# Patient Record
Sex: Female | Born: 1975 | Race: Black or African American | Hispanic: No | Marital: Single | Smoking: Never smoker
Health system: Southern US, Community
[De-identification: ages and names within clinical notes are randomized; demographics above are authoritative.]

## PROBLEM LIST (undated history)

## (undated) DIAGNOSIS — F32A Depression, unspecified: Secondary | ICD-10-CM

## (undated) DIAGNOSIS — F329 Major depressive disorder, single episode, unspecified: Secondary | ICD-10-CM

## (undated) DIAGNOSIS — F419 Anxiety disorder, unspecified: Secondary | ICD-10-CM

## (undated) DIAGNOSIS — G8929 Other chronic pain: Secondary | ICD-10-CM

## (undated) HISTORY — DX: Anxiety disorder, unspecified: F41.9

## (undated) HISTORY — DX: Depression, unspecified: F32.A

## (undated) HISTORY — DX: Other chronic pain: G89.29

---
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
## 1999-11-26 ENCOUNTER — Encounter: Payer: Self-pay | Admitting: *Deleted

## 1999-11-26 ENCOUNTER — Inpatient Hospital Stay (HOSPITAL_COMMUNITY): Admission: AD | Admit: 1999-11-26 | Discharge: 1999-11-26 | Payer: Self-pay | Admitting: *Deleted

## 2000-01-07 ENCOUNTER — Other Ambulatory Visit: Admission: RE | Admit: 2000-01-07 | Discharge: 2000-01-07 | Payer: Self-pay | Admitting: *Deleted

## 2000-07-21 ENCOUNTER — Inpatient Hospital Stay (HOSPITAL_COMMUNITY): Admission: AD | Admit: 2000-07-21 | Discharge: 2000-07-21 | Payer: Self-pay | Admitting: Obstetrics

## 2000-07-23 ENCOUNTER — Inpatient Hospital Stay (HOSPITAL_COMMUNITY): Admission: AD | Admit: 2000-07-23 | Discharge: 2000-07-23 | Payer: Self-pay | Admitting: Obstetrics

## 2000-07-31 ENCOUNTER — Inpatient Hospital Stay (HOSPITAL_COMMUNITY): Admission: AD | Admit: 2000-07-31 | Discharge: 2000-08-04 | Payer: Self-pay | Admitting: *Deleted

## 2000-08-05 ENCOUNTER — Encounter: Admission: RE | Admit: 2000-08-05 | Discharge: 2000-09-04 | Payer: Self-pay | Admitting: *Deleted

## 2000-08-14 ENCOUNTER — Inpatient Hospital Stay (HOSPITAL_COMMUNITY): Admission: AD | Admit: 2000-08-14 | Discharge: 2000-08-14 | Payer: Self-pay | Admitting: Obstetrics & Gynecology

## 2000-08-18 ENCOUNTER — Encounter: Admission: RE | Admit: 2000-08-18 | Discharge: 2000-08-18 | Payer: Self-pay | Admitting: Obstetrics & Gynecology

## 2012-11-13 ENCOUNTER — Emergency Department (INDEPENDENT_AMBULATORY_CARE_PROVIDER_SITE_OTHER)
Admission: EM | Admit: 2012-11-13 | Discharge: 2012-11-13 | Disposition: A | Discharge status: Home / Self Care | Payer: Medicaid Other | Source: Home / Self Care | Attending: Family Medicine | Admitting: Family Medicine

## 2012-11-13 ENCOUNTER — Encounter (HOSPITAL_COMMUNITY): Payer: Self-pay | Admitting: Emergency Medicine

## 2012-11-13 DIAGNOSIS — L02219 Cutaneous abscess of trunk, unspecified: Secondary | ICD-10-CM

## 2012-11-13 DIAGNOSIS — L02216 Cutaneous abscess of umbilicus: Secondary | ICD-10-CM

## 2012-11-13 MED ORDER — SULFAMETHOXAZOLE-TRIMETHOPRIM 800-160 MG PO TABS: 1.0000 | ORAL_TABLET | Freq: Two times a day (BID) | ORAL | Status: DC

## 2012-11-13 NOTE — ED Provider Notes (Signed)
Sheila Weber is a 37 y.o. female who presents to Urgent Care today for small umbilical abscess starting Monday. It is somewhat itchy and not particularly painful. No fevers or chills nausea vomiting or diarrhea. She feels well otherwise.   History reviewed. No pertinent past medical history. History  Substance Use Topics  . Smoking status: Never Smoker   . Smokeless tobacco: Not on file  . Alcohol Use: No   ROS as above Medications reviewed. No current facility-administered medications for this encounter.   Current Outpatient Prescriptions  Medication Sig Dispense Refill  . sulfamethoxazole-trimethoprim (SEPTRA DS) 800-160 MG per tablet Take 1 tablet by mouth 2 (two) times daily.  14 tablet  0    Exam:  BP 101/70  Pulse 82  Temp(Src) 98.2 F (36.8 C) (Oral)  Resp 16  SpO2 98%  LMP 11/01/2012 Gen: Well NAD SKIN: Small 1 cm very superficial pustule-type abscess. No surrounding erythema  Procedure note abscess incision and drainage:  Consent obtained Area cleaned with alcohol) applied and a 11 blade scalpel was used to make a superficial cut.  A small amount of pus was drained and cultured Patient tolerated the procedure well  Assessment and Plan: 36 y.o. female with umbilical skin abscess.  Culture pending Plan to treat empirically with Septra Discussed warning signs or symptoms. Please see discharge instructions. Patient expresses understanding. F/u PRN     Rodolph Bong, MD 11/13/12 [phone]

## 2012-11-13 NOTE — Discharge Instructions (Signed)
Thank you for coming in today. Take antibiotics twice daily for one week Come back if getting worse  Abscess An abscess is an infected area that contains a collection of pus and debris.It can occur in almost any part of the body. An abscess is also known as a furuncle or boil. CAUSES  An abscess occurs when tissue gets infected. This can occur from blockage of oil or sweat glands, infection of hair follicles, or a minor injury to the skin. As the body tries to fight the infection, pus collects in the area and creates pressure under the skin. This pressure causes pain. People with weakened immune systems have difficulty fighting infections and get certain abscesses more often.  SYMPTOMS Usually an abscess develops on the skin and becomes a painful mass that is red, warm, and tender. If the abscess forms under the skin, you may feel a moveable soft area under the skin. Some abscesses break open (rupture) on their own, but most will continue to get worse without care. The infection can spread deeper into the body and eventually into the bloodstream, causing you to feel ill.  DIAGNOSIS  Your caregiver will take your medical history and perform a physical exam. A sample of fluid may also be taken from the abscess to determine what is causing your infection. TREATMENT  Your caregiver may prescribe antibiotic medicines to fight the infection. However, taking antibiotics alone usually does not cure an abscess. Your caregiver may need to make a small cut (incision) in the abscess to drain the pus. In some cases, gauze is packed into the abscess to reduce pain and to continue draining the area. HOME CARE INSTRUCTIONS   Only take over-the-counter or prescription medicines for pain, discomfort, or fever as directed by your caregiver.  If you were prescribed antibiotics, take them as directed. Finish them even if you start to feel better.  If gauze is used, follow your caregiver's directions for changing the  gauze.  To avoid spreading the infection:  Keep your draining abscess covered with a bandage.  Wash your hands well.  Do not share personal care items, towels, or whirlpools with others.  Avoid skin contact with others.  Keep your skin and clothes clean around the abscess.  Keep all follow-up appointments as directed by your caregiver. SEEK MEDICAL CARE IF:   You have increased pain, swelling, redness, fluid drainage, or bleeding.  You have muscle aches, chills, or a general ill feeling.  You have a fever. MAKE SURE YOU:   Understand these instructions.  Will watch your condition.  Will get help right away if you are not doing well or get worse. Document Released: 10/23/2004 Document Revised: 07/15/2011 Document Reviewed: 03/28/2011 [facility] Patient Information 2014 [place], [place].

## 2012-11-13 NOTE — ED Notes (Signed)
Pt c/o abscess around naval area onset Monday Sxs include: mild pain and itchiness Denies: drainage, fevers Alert w/no signs of acute distress.

## 2012-11-15 LAB — CULTURE, ROUTINE-ABSCESS

## 2012-11-16 ENCOUNTER — Telehealth (HOSPITAL_COMMUNITY): Payer: Self-pay | Admitting: *Deleted

## 2012-11-16 ENCOUNTER — Telehealth (HOSPITAL_COMMUNITY): Payer: Self-pay | Admitting: Family Medicine

## 2012-11-16 MED ORDER — CEPHALEXIN 500 MG PO CAPS: 500.0000 mg | ORAL_CAPSULE | Freq: Four times a day (QID) | ORAL | Status: DC

## 2012-11-16 NOTE — ED Notes (Signed)
He should call back. Called in Keflex to [facility] pharmacy   Rodolph Bong, MD 11/16/12 [phone]

## 2012-11-16 NOTE — ED Notes (Signed)
Called about abscess culture. OSSA resistant to Septra.  Need to call in Keflex.  No pharmacy listed.  Left message.   Rodolph Bong, MD 11/16/12 [phone]

## 2012-11-16 NOTE — ED Notes (Addendum)
Abscess culture umbilicus: Abundant Staph. Aureus.  Treated with Septra DS- resistant.  10/20 Message sent to Dr. Denyse Amass.  1021  Dr. Denyse Amass e-prescribed Keflex to [facility] on [address].  I called pt. and left a message to call. Sheila Weber 11/16/2012 Pt. called back.  Pt. verified x 2 and given results.  Pt. told she needs Rx. of Keflex and where it was sent and to stop the Septra DS.  Pt. said the doctor called her this morning and told her.  She is at the pharmacy now picking it up.  She wanted to make sure there were no changes. I told her I was sorry, I did not know the doctor had talked to her already.  No change in medicine from what he told her this morning. Sheila Weber 11/16/2012

## 2012-11-22 NOTE — ED Notes (Signed)
Pt  Reports    She  Lost  Her last  2  Days    Of keflex      [facility]  [facility]     Per  zach    Pa

## 2013-03-14 ENCOUNTER — Other Ambulatory Visit (HOSPITAL_COMMUNITY)
Admission: RE | Admit: 2013-03-14 | Discharge: 2013-03-14 | Disposition: A | Discharge status: Home / Self Care | Payer: Medicaid Other | Source: Ambulatory Visit | Attending: Obstetrics & Gynecology | Admitting: Obstetrics & Gynecology

## 2013-03-14 ENCOUNTER — Encounter: Payer: Self-pay | Admitting: Obstetrics & Gynecology

## 2013-03-14 ENCOUNTER — Ambulatory Visit (INDEPENDENT_AMBULATORY_CARE_PROVIDER_SITE_OTHER): Payer: Medicaid Other | Admitting: Obstetrics & Gynecology

## 2013-03-14 VITALS — BP 114/74 | HR 78 | Temp 99.1°F | Ht 66.0 in | Wt 164.3 lb

## 2013-03-14 DIAGNOSIS — Z309 Encounter for contraceptive management, unspecified: Secondary | ICD-10-CM

## 2013-03-14 DIAGNOSIS — Z01419 Encounter for gynecological examination (general) (routine) without abnormal findings: Secondary | ICD-10-CM

## 2013-03-14 DIAGNOSIS — Z1151 Encounter for screening for human papillomavirus (HPV): Secondary | ICD-10-CM | POA: Insufficient documentation

## 2013-03-14 NOTE — Progress Notes (Signed)
    GYNECOLOGY CLINIC ANNUAL PREVENTATIVE CARE ENCOUNTER NOTE  Subjective:     Sheila Weber is a 38 y.o. G67P2002 female here for a routine annual gynecologic exam.  Current complaints: none.     Gynecologic History Patient's last menstrual period was 02/28/2013. Contraception: none Last Pap: last year. Results were: normal  Obstetric History OB History  Gravida Para Term Preterm AB SAB TAB Ectopic Multiple Living  2 2 2       2     # Outcome Date GA Lbr Len/2nd Weight Sex Delivery Anes PTL Lv  2 TRM 2004    M LTCS     1 TRM 2002    F LTCS        The following portions of the patient's history were reviewed and updated as appropriate: allergies, current medications, past family history, past medical history, past social history, past surgical history and problem list. Works at [employer], also works as Academic librarian.  Review of Systems Pertinent items are noted in HPI.    Objective:   BP 114/74  Pulse 78  Temp(Src) 99.1 F (37.3 C) (Oral)  Ht 5' 6"  (1.676 m)  Wt 164 lb 4.8 oz (74.526 kg)  BMI 26.53 kg/m2  LMP 02/28/2013 GENERAL: Well-developed, well-nourished female in no acute distress.  HEENT: Normocephalic, atraumatic. Sclerae anicteric.  NECK: Supple. Normal thyroid.  LUNGS: Clear to auscultation bilaterally.  HEART: Regular rate and rhythm. BREASTS: Symmetric in size. No masses, skin changes, nipple drainage, or lymphadenopathy. ABDOMEN: Soft, nontender, nondistended. No organomegaly. PELVIC: Normal external female genitalia. Vagina is pink and rugated.  Normal discharge. Normal cervix contour. Pap smear obtained. Uterus is normal in size. No adnexal mass or tenderness.  EXTREMITIES: No cyanosis, clubbing, or edema, 2+ distal pulses.   Assessment:    Healthy female exam.    Plan:   Pap done, will follow up results and manage accordingly. Routine preventative health maintenance measures emphasized   Verita Schneiders, MD, [place] Attending [place], [place]

## 2013-03-14 NOTE — Patient Instructions (Signed)
Preventive Care for Adults, Female A healthy lifestyle and preventive care can promote health and wellness. Preventive health guidelines for women include the following key practices.  A routine yearly physical is a good way to check with your health care provider about your health and preventive screening. It is a chance to share any concerns and updates on your health and to receive a thorough exam.  Visit your dentist for a routine exam and preventive care every 6 months. Brush your teeth twice a day and floss once a day. Good oral hygiene prevents tooth decay and gum disease.  The frequency of eye exams is based on your age, health, family medical history, use of contact lenses, and other factors. Follow your health care provider's recommendations for frequency of eye exams.  Eat a healthy diet. Foods like vegetables, fruits, whole grains, low-fat dairy products, and lean protein foods contain the nutrients you need without too many calories. Decrease your intake of foods high in solid fats, added sugars, and salt. Eat the right amount of calories for you.Get information about a proper diet from your health care provider, if necessary.  Regular physical exercise is one of the most important things you can do for your health. Most adults should get at least 150 minutes of moderate-intensity exercise (any activity that increases your heart rate and causes you to sweat) each week. In addition, most adults need muscle-strengthening exercises on 2 or more days a week.  Maintain a healthy weight. The body mass index (BMI) is a screening tool to identify possible weight problems. It provides an estimate of body fat based on height and weight. Your health care provider can find your BMI, and can help you achieve or maintain a healthy weight.For adults 20 years and older:  A BMI below 18.5 is considered underweight.  A BMI of 18.5 to 24.9 is normal.  A BMI of 25 to 29.9 is considered  overweight.  A BMI of 30 and above is considered obese.  Maintain normal blood lipids and cholesterol levels by exercising and minimizing your intake of saturated fat. Eat a balanced diet with plenty of fruit and vegetables. Blood tests for lipids and cholesterol should begin at age 20 and be repeated every 5 years. If your lipid or cholesterol levels are high, you are over 50, or you are at high risk for heart disease, you may need your cholesterol levels checked more frequently.Ongoing high lipid and cholesterol levels should be treated with medicines if diet and exercise are not working.  If you smoke, find out from your health care provider how to quit. If you do not use tobacco, do not start.  Lung cancer screening is recommended for adults aged 55 80 years who are at high risk for developing lung cancer because of a history of smoking. A yearly low-dose CT scan of the lungs is recommended for people who have at least a 30-pack-year history of smoking and are a current smoker or have quit within the past 15 years. A pack year of smoking is smoking an average of 1 pack of cigarettes a day for 1 year (for example: 1 pack a day for 30 years or 2 packs a day for 15 years). Yearly screening should continue until the smoker has stopped smoking for at least 15 years. Yearly screening should be stopped for people who develop a health problem that would prevent them from having lung cancer treatment.  If you are pregnant, do not drink alcohol. If you   are breastfeeding, be very cautious about drinking alcohol. If you are not pregnant and choose to drink alcohol, do not have more than 1 drink per day. One drink is considered to be 12 ounces (355 mL) of beer, 5 ounces (148 mL) of wine, or 1.5 ounces (44 mL) of liquor.  Avoid use of street drugs. Do not share needles with anyone. Ask for help if you need support or instructions about stopping the use of drugs.  High blood pressure causes heart disease and  increases the risk of stroke. Your blood pressure should be checked at least every 1 to 2 years. Ongoing high blood pressure should be treated with medicines if weight loss and exercise do not work.  If you are 20 40 years old, ask your health care provider if you should take aspirin to prevent strokes.  Diabetes screening involves taking a blood sample to check your fasting blood sugar level. This should be done once every 3 years, after age 35, if you are within normal weight and without risk factors for diabetes. Testing should be considered at a younger age or be carried out more frequently if you are overweight and have at least 1 risk factor for diabetes.  Breast cancer screening is essential preventive care for women. You should practice "breast self-awareness." This means understanding the normal appearance and feel of your breasts and may include breast self-examination. Any changes detected, no matter how small, should be reported to a health care provider. Women in their 42s and 30s should have a clinical breast exam (CBE) by a health care provider as part of a regular health exam every 1 to 3 years. After age 74, women should have a CBE every year. Starting at age 43, women should consider having a mammogram (breast X-ray test) every year. Women who have a family history of breast cancer should talk to their health care provider about genetic screening. Women at a high risk of breast cancer should talk to their health care providers about having an MRI and a mammogram every year.  Breast cancer gene (BRCA)-related cancer risk assessment is recommended for women who have family members with BRCA-related cancers. BRCA-related cancers include breast, ovarian, tubal, and peritoneal cancers. Having family members with these cancers may be associated with an increased risk for harmful changes (mutations) in the breast cancer genes BRCA1 and BRCA2. Results of the assessment will determine the need for  genetic counseling and BRCA1 and BRCA2 testing.  The Pap test is a screening test for cervical cancer. A Pap test can show cell changes on the cervix that might become cervical cancer if left untreated. A Pap test is a procedure in which cells are obtained and examined from the lower end of the uterus (cervix).  Women should have a Pap test starting at age 60.  Between ages 63 and 62, Pap tests should be repeated every 2 years.  Beginning at age 43, you should have a Pap test every 3 years as long as the past 3 Pap tests have been normal.  Some women have medical problems that increase the chance of getting cervical cancer. Talk to your health care provider about these problems. It is especially important to talk to your health care provider if a new problem develops soon after your last Pap test. In these cases, your health care provider may recommend more frequent screening and Pap tests.  The above recommendations are the same for women who have or have not gotten the vaccine  for human papillomavirus (HPV).  If you had a hysterectomy for a problem that was not cancer or a condition that could lead to cancer, then you no longer need Pap tests. Even if you no longer need a Pap test, a regular exam is a good idea to make sure no other problems are starting.  If you are between ages 65 and 70 years, and you have had normal Pap tests going back 10 years, you no longer need Pap tests. Even if you no longer need a Pap test, a regular exam is a good idea to make sure no other problems are starting.  If you have had past treatment for cervical cancer or a condition that could lead to cancer, you need Pap tests and screening for cancer for at least 20 years after your treatment.  If Pap tests have been discontinued, risk factors (such as a new sexual partner) need to be reassessed to determine if screening should be resumed.  The HPV test is an additional test that may be used for cervical cancer  screening. The HPV test looks for the virus that can cause the cell changes on the cervix. The cells collected during the Pap test can be tested for HPV. The HPV test could be used to screen women aged 30 years and older, and should be used in women of any age who have unclear Pap test results. After the age of 30, women should have HPV testing at the same frequency as a Pap test.  Colorectal cancer can be detected and often prevented. Most routine colorectal cancer screening begins at the age of 50 years and continues through age 75 years. However, your health care provider may recommend screening at an earlier age if you have risk factors for colon cancer. On a yearly basis, your health care provider may provide home test kits to check for hidden blood in the stool. Use of a small camera at the end of a tube, to directly examine the colon (sigmoidoscopy or colonoscopy), can detect the earliest forms of colorectal cancer. Talk to your health care provider about this at age 50, when routine screening begins. Direct exam of the colon should be repeated every 5 10 years through age 75 years, unless early forms of pre-cancerous polyps or small growths are found.  People who are at an increased risk for hepatitis B should be screened for this virus. You are considered at high risk for hepatitis B if:  You were born in a country where hepatitis B occurs often. Talk with your health care provider about which countries are considered high risk.  Your parents were born in a high-risk country and you have not received a shot to protect against hepatitis B (hepatitis B vaccine).  You have HIV or AIDS.  You use needles to inject street drugs.  You live with, or have sex with, someone who has Hepatitis B.  You get hemodialysis treatment.  You take certain medicines for conditions like cancer, organ transplantation, and autoimmune conditions.  Hepatitis C blood testing is recommended for all people born from  1945 through 1965 and any individual with known risks for hepatitis C.  Practice safe sex. Use condoms and avoid high-risk sexual practices to reduce the spread of sexually transmitted infections (STIs). STIs include gonorrhea, chlamydia, syphilis, trichomonas, herpes, HPV, and human immunodeficiency virus (HIV). Herpes, HIV, and HPV are viral illnesses that have no cure. They can result in disability, cancer, and death. Sexually active women aged 25   years and younger should be checked for chlamydia. Older women with new or multiple partners should also be tested for chlamydia. Testing for other STIs is recommended if you are sexually active and at increased risk.  Osteoporosis is a disease in which the bones lose minerals and strength with aging. This can result in serious bone fractures or breaks. The risk of osteoporosis can be identified using a bone density scan. Women ages 65 years and over and women at risk for fractures or osteoporosis should discuss screening with their health care providers. Ask your health care provider whether you should take a calcium supplement or vitamin D to reduce the rate of osteoporosis.  Menopause can be associated with physical symptoms and risks. Hormone replacement therapy is available to decrease symptoms and risks. You should talk to your health care provider about whether hormone replacement therapy is right for you.  Use sunscreen. Apply sunscreen liberally and repeatedly throughout the day. You should seek shade when your shadow is shorter than you. Protect yourself by wearing long sleeves, pants, a wide-brimmed hat, and sunglasses year round, whenever you are outdoors.  Once a month, do a whole body skin exam, using a mirror to look at the skin on your back. Tell your health care provider of new moles, moles that have irregular borders, moles that are larger than a pencil eraser, or moles that have changed in shape or color.  Stay current with required  vaccines (immunizations).  Influenza vaccine. All adults should be immunized every year.  Tetanus, diphtheria, and acellular pertussis (Td, Tdap) vaccine. Pregnant women should receive 1 dose of Tdap vaccine during each pregnancy. The dose should be obtained regardless of the length of time since the last dose. Immunization is preferred during the 27th 36th week of gestation. An adult who has not previously received Tdap or who does not know her vaccine status should receive 1 dose of Tdap. This initial dose should be followed by tetanus and diphtheria toxoids (Td) booster doses every 10 years. Adults with an unknown or incomplete history of completing a 3-dose immunization series with Td-containing vaccines should begin or complete a primary immunization series including a Tdap dose. Adults should receive a Td booster every 10 years.  Varicella vaccine. An adult without evidence of immunity to varicella should receive 2 doses or a second dose if she has previously received 1 dose. Pregnant females who do not have evidence of immunity should receive the first dose after pregnancy. This first dose should be obtained before leaving the health care facility. The second dose should be obtained 4 8 weeks after the first dose.  Human papillomavirus (HPV) vaccine. Females aged 13 26 years who have not received the vaccine previously should obtain the 3-dose series. The vaccine is not recommended for use in pregnant females. However, pregnancy testing is not needed before receiving a dose. If a female is found to be pregnant after receiving a dose, no treatment is needed. In that case, the remaining doses should be delayed until after the pregnancy. Immunization is recommended for any person with an immunocompromised condition through the age of 26 years if she did not get any or all doses earlier. During the 3-dose series, the second dose should be obtained 4 8 weeks after the first dose. The third dose should be  obtained 24 weeks after the first dose and 16 weeks after the second dose.  Zoster vaccine. One dose is recommended for adults aged 60 years or older unless certain   conditions are present.  Measles, mumps, and rubella (MMR) vaccine. Adults born before 1957 generally are considered immune to measles and mumps. Adults born in 1957 or later should have 1 or more doses of MMR vaccine unless there is a contraindication to the vaccine or there is laboratory evidence of immunity to each of the three diseases. A routine second dose of MMR vaccine should be obtained at least 28 days after the first dose for students attending postsecondary schools, health care workers, or international travelers. People who received inactivated measles vaccine or an unknown type of measles vaccine during 1963 1967 should receive 2 doses of MMR vaccine. People who received inactivated mumps vaccine or an unknown type of mumps vaccine before 1979 and are at high risk for mumps infection should consider immunization with 2 doses of MMR vaccine. For females of childbearing age, rubella immunity should be determined. If there is no evidence of immunity, females who are not pregnant should be vaccinated. If there is no evidence of immunity, females who are pregnant should delay immunization until after pregnancy. Unvaccinated health care workers born before 1957 who lack laboratory evidence of measles, mumps, or rubella immunity or laboratory confirmation of disease should consider measles and mumps immunization with 2 doses of MMR vaccine or rubella immunization with 1 dose of MMR vaccine.  Pneumococcal 13-valent conjugate (PCV13) vaccine. When indicated, a person who is uncertain of her immunization history and has no record of immunization should receive the PCV13 vaccine. An adult aged 19 years or older who has certain medical conditions and has not been previously immunized should receive 1 dose of PCV13 vaccine. This PCV13 should be  followed with a dose of pneumococcal polysaccharide (PPSV23) vaccine. The PPSV23 vaccine dose should be obtained at least 8 weeks after the dose of PCV13 vaccine. An adult aged 19 years or older who has certain medical conditions and previously received 1 or more doses of PPSV23 vaccine should receive 1 dose of PCV13. The PCV13 vaccine dose should be obtained 1 or more years after the last PPSV23 vaccine dose.  Pneumococcal polysaccharide (PPSV23) vaccine. When PCV13 is also indicated, PCV13 should be obtained first. All adults aged 65 years and older should be immunized. An adult younger than age 65 years who has certain medical conditions should be immunized. Any person who resides in a nursing home or long-term care facility should be immunized. An adult smoker should be immunized. People with an immunocompromised condition and certain other conditions should receive both PCV13 and PPSV23 vaccines. People with human immunodeficiency virus (HIV) infection should be immunized as soon as possible after diagnosis. Immunization during chemotherapy or radiation therapy should be avoided. Routine use of PPSV23 vaccine is not recommended for American Indians, [place] Natives, or people younger than 65 years unless there are medical conditions that require PPSV23 vaccine. When indicated, people who have unknown immunization and have no record of immunization should receive PPSV23 vaccine. One-time revaccination 5 years after the first dose of PPSV23 is recommended for people aged 19 64 years who have chronic kidney failure, nephrotic syndrome, asplenia, or immunocompromised conditions. People who received 1 2 doses of PPSV23 before age 65 years should receive another dose of PPSV23 vaccine at age 65 years or later if at least 5 years have passed since the previous dose. Doses of PPSV23 are not needed for people immunized with PPSV23 at or after age 65 years.  Meningococcal vaccine. Adults with asplenia or persistent  complement component deficiencies should receive 2   doses of quadrivalent meningococcal conjugate (MenACWY-D) vaccine. The doses should be obtained at least 2 months apart. Microbiologists working with certain meningococcal bacteria, military recruits, people at risk during an outbreak, and people who travel to or live in countries with a high rate of meningitis should be immunized. A first-year college student up through age 21 years who is living in a residence hall should receive a dose if she did not receive a dose on or after her 16th birthday. Adults who have certain high-risk conditions should receive one or more doses of vaccine.  Hepatitis A vaccine. Adults who wish to be protected from this disease, have certain high-risk conditions, work with hepatitis A-infected animals, work in hepatitis A research labs, or travel to or work in countries with a high rate of hepatitis A should be immunized. Adults who were previously unvaccinated and who anticipate close contact with an international adoptee during the first 60 days after arrival in the [place] from a country with a high rate of hepatitis A should be immunized.  Hepatitis B vaccine. Adults who wish to be protected from this disease, have certain high-risk conditions, may be exposed to blood or other infectious body fluids, are household contacts or sex partners of hepatitis B positive people, are clients or workers in certain care facilities, or travel to or work in countries with a high rate of hepatitis B should be immunized.  Haemophilus influenzae type b (Hib) vaccine. A previously unvaccinated person with asplenia or sickle cell disease or having a scheduled splenectomy should receive 1 dose of Hib vaccine. Regardless of previous immunization, a recipient of a hematopoietic stem cell transplant should receive a 3-dose series 6 12 months after her successful transplant. Hib vaccine is not recommended for adults with HIV  infection. Preventive Services / Frequency Ages 19 to 39years  Blood pressure check.** / Every 1 to 2 years.  Lipid and cholesterol check.** / Every 5 years beginning at age 20.  Clinical breast exam.** / Every 3 years for women in their 20s and 30s.  BRCA-related cancer risk assessment.** / For women who have family members with a BRCA-related cancer (breast, ovarian, tubal, or peritoneal cancers).  Pap test.** / Every 2 years from ages 21 through 29. Every 3 years starting at age 30 through age 65 or 70 with a history of 3 consecutive normal Pap tests.  HPV screening.** / Every 3 years from ages 30 through ages 65 to 70 with a history of 3 consecutive normal Pap tests.  Hepatitis C blood test.** / For any individual with known risks for hepatitis C.  Skin self-exam. / Monthly.  Influenza vaccine. / Every year.  Tetanus, diphtheria, and acellular pertussis (Tdap, Td) vaccine.** / Consult your health care provider. Pregnant women should receive 1 dose of Tdap vaccine during each pregnancy. 1 dose of Td every 10 years.  Varicella vaccine.** / Consult your health care provider. Pregnant females who do not have evidence of immunity should receive the first dose after pregnancy.  HPV vaccine. / 3 doses over 6 months, if 26 and younger. The vaccine is not recommended for use in pregnant females. However, pregnancy testing is not needed before receiving a dose.  Measles, mumps, rubella (MMR) vaccine.** / You need at least 1 dose of MMR if you were born in 1957 or later. You may also need a 2nd dose. For females of childbearing age, rubella immunity should be determined. If there is no evidence of immunity, females who are not   pregnant should be vaccinated. If there is no evidence of immunity, females who are pregnant should delay immunization until after pregnancy.  Pneumococcal 13-valent conjugate (PCV13) vaccine.** / Consult your health care provider.  Pneumococcal polysaccharide (PPSV23)  vaccine.** / 1 to 2 doses if you smoke cigarettes or if you have certain conditions.  Meningococcal vaccine.** / 1 dose if you are age 88 to 41 years and a Market researcher living in a residence hall, or have one of several medical conditions, you need to get vaccinated against meningococcal disease. You may also need additional booster doses.  Hepatitis A vaccine.** / Consult your health care provider.  Hepatitis B vaccine.** / Consult your health care provider.  Haemophilus influenzae type b (Hib) vaccine.** / Consult your health care provider. Ages 45 to 64years  Blood pressure check.** / Every 1 to 2 years.  Lipid and cholesterol check.** / Every 5 years beginning at age 2 years.  Lung cancer screening. / Every year if you are aged 10 80 years and have a 30-pack-year history of smoking and currently smoke or have quit within the past 15 years. Yearly screening is stopped once you have quit smoking for at least 15 years or develop a health problem that would prevent you from having lung cancer treatment.  Clinical breast exam.** / Every year after age 28 years.  BRCA-related cancer risk assessment.** / For women who have family members with a BRCA-related cancer (breast, ovarian, tubal, or peritoneal cancers).  Mammogram.** / Every year beginning at age 63 years and continuing for as long as you are in good health. Consult with your health care provider.  Pap test.** / Every 3 years starting at age 71 years through age 39 or 90 years with a history of 3 consecutive normal Pap tests.  HPV screening.** / Every 3 years from ages 27 years through ages 32 to 72 years with a history of 3 consecutive normal Pap tests.  Fecal occult blood test (FOBT) of stool. / Every year beginning at age 40 years and continuing until age 29 years. You may not need to do this test if you get a colonoscopy every 10 years.  Flexible sigmoidoscopy or colonoscopy.** / Every 5 years for a flexible  sigmoidoscopy or every 10 years for a colonoscopy beginning at age 66 years and continuing until age 47 years.  Hepatitis C blood test.** / For all people born from [birth year] through 1967 and any individual with known risks for hepatitis C.  Skin self-exam. / Monthly.  Influenza vaccine. / Every year.  Tetanus, diphtheria, and acellular pertussis (Tdap/Td) vaccine.** / Consult your health care provider. Pregnant women should receive 1 dose of Tdap vaccine during each pregnancy. 1 dose of Td every 10 years.  Varicella vaccine.** / Consult your health care provider. Pregnant females who do not have evidence of immunity should receive the first dose after pregnancy.  Zoster vaccine.** / 1 dose for adults aged 39 years or older.  Measles, mumps, rubella (MMR) vaccine.** / You need at least 1 dose of MMR if you were born in 1957 or later. You may also need a 2nd dose. For females of childbearing age, rubella immunity should be determined. If there is no evidence of immunity, females who are not pregnant should be vaccinated. If there is no evidence of immunity, females who are pregnant should delay immunization until after pregnancy.  Pneumococcal 13-valent conjugate (PCV13) vaccine.** / Consult your health care provider.  Pneumococcal polysaccharide (PPSV23) vaccine.** / 1  to 2 doses if you smoke cigarettes or if you have certain conditions.  Meningococcal vaccine.** / Consult your health care provider.  Hepatitis A vaccine.** / Consult your health care provider.  Hepatitis B vaccine.** / Consult your health care provider.  Haemophilus influenzae type b (Hib) vaccine.** / Consult your health care provider. Ages 66 years and over  Blood pressure check.** / Every 1 to 2 years.  Lipid and cholesterol check.** / Every 5 years beginning at age 50 years.  Lung cancer screening. / Every year if you are aged 65 80 years and have a 30-pack-year history of smoking and currently smoke or have quit  within the past 15 years. Yearly screening is stopped once you have quit smoking for at least 15 years or develop a health problem that would prevent you from having lung cancer treatment.  Clinical breast exam.** / Every year after age 71 years.  BRCA-related cancer risk assessment.** / For women who have family members with a BRCA-related cancer (breast, ovarian, tubal, or peritoneal cancers).  Mammogram.** / Every year beginning at age 58 years and continuing for as long as you are in good health. Consult with your health care provider.  Pap test.** / Every 3 years starting at age 51 years through age 34 or 38 years with 3 consecutive normal Pap tests. Testing can be stopped between 65 and 70 years with 3 consecutive normal Pap tests and no abnormal Pap or HPV tests in the past 10 years.  HPV screening.** / Every 3 years from ages 35 years through ages 84 or 80 years with a history of 3 consecutive normal Pap tests. Testing can be stopped between 65 and 70 years with 3 consecutive normal Pap tests and no abnormal Pap or HPV tests in the past 10 years.  Fecal occult blood test (FOBT) of stool. / Every year beginning at age 26 years and continuing until age 51 years. You may not need to do this test if you get a colonoscopy every 10 years.  Flexible sigmoidoscopy or colonoscopy.** / Every 5 years for a flexible sigmoidoscopy or every 10 years for a colonoscopy beginning at age 55 years and continuing until age 19 years.  Hepatitis C blood test.** / For all people born from [birth year] through 1967 and any individual with known risks for hepatitis C.  Osteoporosis screening.** / A one-time screening for women ages 68 years and over and women at risk for fractures or osteoporosis.  Skin self-exam. / Monthly.  Influenza vaccine. / Every year.  Tetanus, diphtheria, and acellular pertussis (Tdap/Td) vaccine.** / 1 dose of Td every 10 years.  Varicella vaccine.** / Consult your health care  provider.  Zoster vaccine.** / 1 dose for adults aged 21 years or older.  Pneumococcal 13-valent conjugate (PCV13) vaccine.** / Consult your health care provider.  Pneumococcal polysaccharide (PPSV23) vaccine.** / 1 dose for all adults aged 43 years and older.  Meningococcal vaccine.** / Consult your health care provider.  Hepatitis A vaccine.** / Consult your health care provider.  Hepatitis B vaccine.** / Consult your health care provider.  Haemophilus influenzae type b (Hib) vaccine.** / Consult your health care provider. ** Family history and personal history of risk and conditions may change your health care provider's recommendations. Document Released: 03/11/2001 Document Revised: 11/03/2012 Document Reviewed: 06/10/2010 [facility] Patient Information 2014 [place], [place].  Thank you for enrolling in [facility]. Please follow the instructions below to securely access your online medical record. MyChart allows you to send messages  to your doctor, view your test results, manage appointments, and more.   How Do I Sign Up? 1. In your Internet browser, go to [employer] and enter [email or web address]. 2. Click on the Sign Up Now link in the Sign In box. You will see the New Member Sign Up page. 3. Enter your MyChart Access Code exactly as it appears below. You will not need to use this code after you've completed the sign-up process. If you do not sign up before the expiration date, you must request a new code.  MyChart Access Code: [email or web address] Expires: 05/13/2013  2:01 PM  4. Enter your Social Security Number ([email or web address]) and Date of Birth (mm/dd/yyyy) as indicated and click Submit. You will be taken to the next sign-up page. 5. Create a MyChart ID. This will be your MyChart login ID and cannot be changed, so think of one that is secure and easy to remember. 6. Create a MyChart password. You can change your password at any time. 7. Enter your Password Reset Question  and Answer. This can be used at a later time if you forget your password.  8. Enter your e-mail address. You will receive e-mail notification when new information is available in [facility]. 9. Click Sign Up. You can now view your medical record.   Additional Information Remember, MyChart is NOT to be used for urgent needs. For medical emergencies, dial 911.

## 2013-10-10 ENCOUNTER — Other Ambulatory Visit: Payer: Self-pay | Admitting: Obstetrics & Gynecology

## 2013-10-10 ENCOUNTER — Ambulatory Visit (INDEPENDENT_AMBULATORY_CARE_PROVIDER_SITE_OTHER): Payer: BC Managed Care – PPO | Admitting: Obstetrics & Gynecology

## 2013-10-10 ENCOUNTER — Encounter: Payer: Self-pay | Admitting: Obstetrics & Gynecology

## 2013-10-10 VITALS — BP 122/72 | HR 86 | Temp 99.2°F | Wt 170.7 lb

## 2013-10-10 DIAGNOSIS — R102 Pelvic and perineal pain: Secondary | ICD-10-CM

## 2013-10-10 DIAGNOSIS — N949 Unspecified condition associated with female genital organs and menstrual cycle: Secondary | ICD-10-CM

## 2013-10-10 NOTE — Patient Instructions (Signed)
Pelvic Pain Female pelvic pain can be caused by many different things and start from a variety of places. Pelvic pain refers to pain that is located in the lower half of the abdomen and between your hips. The pain may occur over a short period of time (acute) or may be reoccurring (chronic). The cause of pelvic pain may be related to disorders affecting the female reproductive organs (gynecologic), but it may also be related to the bladder, kidney stones, an intestinal complication, or muscle or skeletal problems. Getting help right away for pelvic pain is important, especially if there has been severe, sharp, or a sudden onset of unusual pain. It is also important to get help right away because some types of pelvic pain can be life threatening.  CAUSES  Below are only some of the causes of pelvic pain. The causes of pelvic pain can be in one of several categories.   Gynecologic.  Pelvic inflammatory disease.  Sexually transmitted infection.  Ovarian cyst or a twisted ovarian ligament (ovarian torsion).  Uterine lining that grows outside the uterus (endometriosis).  Fibroids, cysts, or tumors.  Ovulation.  Pregnancy.  Pregnancy that occurs outside the uterus (ectopic pregnancy).  Miscarriage.  Labor.  Abruption of the placenta or ruptured uterus.  Infection.  Uterine infection (endometritis).  Bladder infection.  Diverticulitis.  Miscarriage related to a uterine infection (septic abortion).  Bladder.  Inflammation of the bladder (cystitis).  Kidney stone(s).  Gastrointestinal.  Constipation.  Diverticulitis.  Neurologic.  Trauma.  Feeling pelvic pain because of mental or emotional causes (psychosomatic).  Cancers of the bowel or pelvis. EVALUATION  Your caregiver will want to take a careful history of your concerns. This includes recent changes in your health, a careful gynecologic history of your periods (menses), and a sexual history. Obtaining your family  history and medical history is also important. Your caregiver may suggest a pelvic exam. A pelvic exam will help identify the location and severity of the pain. It also helps in the evaluation of which organ system may be involved. In order to identify the cause of the pelvic pain and be properly treated, your caregiver may order tests. These tests may include:   A pregnancy test.  Pelvic ultrasonography.  An X-ray exam of the abdomen.  A urinalysis or evaluation of vaginal discharge.  Blood tests. HOME CARE INSTRUCTIONS   Only take over-the-counter or prescription medicines for pain, discomfort, or fever as directed by your caregiver.   Rest as directed by your caregiver.   Eat a balanced diet.   Drink enough fluids to make your urine clear or pale yellow, or as directed.   Avoid sexual intercourse if it causes pain.   Apply warm or cold compresses to the lower abdomen depending on which one helps the pain.   Avoid stressful situations.   Keep a journal of your pelvic pain. Write down when it started, where the pain is located, and if there are things that seem to be associated with the pain, such as food or your menstrual cycle.  Follow up with your caregiver as directed.  SEEK MEDICAL CARE IF:  Your medicine does not help your pain.  You have abnormal vaginal discharge. SEEK IMMEDIATE MEDICAL CARE IF:   You have heavy bleeding from the vagina.   Your pelvic pain increases.   You feel light-headed or faint.   You have chills.   You have pain with urination or blood in your urine.   You have uncontrolled diarrhea  or vomiting.   You have a fever or persistent symptoms for more than 3 days.  You have a fever and your symptoms suddenly get worse.   You are being physically or sexually abused.  MAKE SURE YOU:  Understand these instructions.  Will watch your condition.  Will get help if you are not doing well or get worse. Document Released:  12/11/2003 Document Revised: 05/30/2013 Document Reviewed: 05/05/2011 [facility] Patient Information 2015 [place], [place]. This information is not intended to replace advice given to you by your health care provider. Make sure you discuss any questions you have with your health care provider.

## 2013-10-10 NOTE — Progress Notes (Signed)
   CLINIC ENCOUNTER NOTE  History:  37 y.o. [id number] here today for bilateral intermittent pelvic pain, none currently.  Pain is sharp and is random, no relation to menses, eating or any particular activity.  No associated GI or GU symptoms.    The following portions of the patient's history were reviewed and updated as appropriate: allergies, current medications, past family history, past medical history, past social history, past surgical history and problem list. Normal pap smear and negative high-risk HPV on 03/14/13.   Review of Systems:  Pertinent items are noted in HPI.  Objective:  Physical Exam BP 122/72  Pulse 86  Temp(Src) 99.2 F (37.3 C) (Oral)  Wt 170 lb 11.2 oz (77.429 kg)  LMP 09/19/2013 Gen: NAD Abd: Soft, nontender and nondistended Pelvic: Normal appearing external genitalia; normal appearing vaginal mucosa and cervix.  Normal discharge.  Small uterus, no other palpable masses, no uterine or adnexal tenderness.  Pelvic cultures ordered.  Assessment & Plan:  Pelvic ultrasound and cultures ordered; will follow up results and manage accordingly.    Verita Schneiders, MD, [place] Attending [place] for [employer], [place]

## 2013-10-11 LAB — WET PREP, GENITAL
Clue Cells Wet Prep HPF POC: NONE SEEN
Trich, Wet Prep: NONE SEEN
WBC, Wet Prep HPF POC: NONE SEEN
Yeast Wet Prep HPF POC: NONE SEEN

## 2013-10-11 LAB — GC/CHLAMYDIA PROBE AMP
CT PROBE, AMP APTIMA: NEGATIVE
GC PROBE AMP APTIMA: NEGATIVE

## 2013-10-18 ENCOUNTER — Ambulatory Visit (HOSPITAL_COMMUNITY): Payer: BC Managed Care – PPO

## 2013-11-28 ENCOUNTER — Encounter: Payer: Self-pay | Admitting: Obstetrics & Gynecology

## 2014-03-23 ENCOUNTER — Telehealth: Payer: Self-pay | Admitting: General Practice

## 2014-03-23 DIAGNOSIS — Z789 Other specified health status: Secondary | ICD-10-CM

## 2014-03-23 MED ORDER — LEVONORGESTREL 0.75 MG PO TABS: 0.7500 mg | ORAL_TABLET | Freq: Two times a day (BID) | ORAL | Status: DC

## 2014-03-23 NOTE — Telephone Encounter (Signed)
Patient called and left message stating she needs a prescription for plan B. Spoke to [facility] who authorized Rx. Called patient and asked when she had had unprotected sex and she states at 3am this morning the condom had broke. Told patient we will send the Rx to her pharmacy and she can go by and pick it up. Patient verbalized understanding and had no questions

## 2014-03-27 ENCOUNTER — Telehealth: Payer: Self-pay | Admitting: Obstetrics and Gynecology

## 2014-03-27 NOTE — Telephone Encounter (Signed)
Patient called seeking advice about when Plan B start to work and for how long. Advised patient that it has to be taken within 72 hours of intercourse but the sooner it is taken, the better the protection against pregnancy. Pt stated understanding had no further questions.

## 2014-05-05 ENCOUNTER — Ambulatory Visit (INDEPENDENT_AMBULATORY_CARE_PROVIDER_SITE_OTHER): Payer: Medicaid Other | Admitting: Medical

## 2014-05-05 ENCOUNTER — Other Ambulatory Visit: Payer: Self-pay | Admitting: Medical

## 2014-05-05 ENCOUNTER — Encounter: Payer: Self-pay | Admitting: Medical

## 2014-05-05 VITALS — BP 129/64 | HR 78 | Temp 98.6°F | Ht 65.0 in | Wt 170.5 lb

## 2014-05-05 DIAGNOSIS — Z118 Encounter for screening for other infectious and parasitic diseases: Secondary | ICD-10-CM

## 2014-05-05 DIAGNOSIS — Z01419 Encounter for gynecological examination (general) (routine) without abnormal findings: Secondary | ICD-10-CM

## 2014-05-05 DIAGNOSIS — Z113 Encounter for screening for infections with a predominantly sexual mode of transmission: Secondary | ICD-10-CM

## 2014-05-05 DIAGNOSIS — N898 Other specified noninflammatory disorders of vagina: Secondary | ICD-10-CM

## 2014-05-05 DIAGNOSIS — Z309 Encounter for contraceptive management, unspecified: Secondary | ICD-10-CM

## 2014-05-05 DIAGNOSIS — N760 Acute vaginitis: Principal | ICD-10-CM

## 2014-05-05 DIAGNOSIS — Z30011 Encounter for initial prescription of contraceptive pills: Secondary | ICD-10-CM

## 2014-05-05 DIAGNOSIS — B9689 Other specified bacterial agents as the cause of diseases classified elsewhere: Secondary | ICD-10-CM

## 2014-05-05 LAB — WET PREP, GENITAL
Trich, Wet Prep: NONE SEEN
WBC, Wet Prep HPF POC: NONE SEEN
YEAST WET PREP: NONE SEEN

## 2014-05-05 MED ORDER — NORGESTIMATE-ETH ESTRADIOL 0.25-35 MG-MCG PO TABS: 1.0000 | ORAL_TABLET | Freq: Every day | ORAL | Status: DC

## 2014-05-05 MED ORDER — METRONIDAZOLE 500 MG PO TABS: 500.0000 mg | ORAL_TABLET | Freq: Two times a day (BID) | ORAL | Status: DC

## 2014-05-05 NOTE — Progress Notes (Signed)
Patient ID: Sheila Weber, female   DOB: 1975-02-18, 39 y.o.   MRN: [id number] Subjective:    Sheila Weber is a 39 y.o. female who presents for an annual exam. The patient has no complaints today. The patient is sexually active. GYN screening history: last pap: approximate date 02/2013 and was normal. The patient wears seatbelts: yes. The patient participates in regular exercise: no. Has the patient ever been transfused or tattooed?: no. The patient reports that there is not domestic violence in her life. Patient states some irregularities in periods since taking Plan B last month. Patient would like STD testing today. Patient states that she is getting married soon and wants to possibly have another baby.   Menstrual History: OB History    Gravida Para Term Preterm AB TAB SAB Ectopic Multiple Living   2 2 2       2       Menarche age: 28  Patient's last menstrual period was 04/28/2014.    The following portions of the patient's history were reviewed and updated as appropriate: allergies, current medications, past family history, past medical history, past social history, past surgical history and problem list.  Review of Systems Pertinent items are noted in HPI.    Objective:     BP 129/64 mmHg  Pulse 78  Temp(Src) 98.6 F (37 C)  Ht 5' 5"  (1.651 m)  Wt 170 lb 8 oz (77.338 kg)  BMI 28.37 kg/m2  LMP 04/28/2014 GENERAL: Well-developed, well-nourished female in no acute distress.  HEENT: Normocephalic, atraumatic. NECK: Supple. Normal thyroid.  LUNGS: Clear to auscultation bilaterally.  HEART: Regular rate and rhythm. BREASTS: Symmetric in size. No masses, skin changes, nipple drainage, or lymphadenopathy. ABDOMEN: Soft, nontender, nondistended. No organomegaly. PELVIC: Normal external female genitalia. Vagina is pink and rugated.  Small amount of thick, pink discharge noted. Normal cervix contour. Wet prep and GC/Chlamydia obtained. Uterus is normal in size. No adnexal mass or  tenderness.  EXTREMITIES: No cyanosis, clubbing, or edema  .    Assessment:    Healthy female exam.   STD testing performed   Plan:     Patient will be scheduled for initial screening mammogram   Patient will be contacted with any abnormal results from today's visit Patient advised to follow-up with [facility] in 1 year for annual exam  Sheila Redden, PA-C 05/05/2014 9:06 AM

## 2014-05-05 NOTE — Patient Instructions (Signed)
Pelvic Exam A pelvic exam is an exam of a woman's outer and inner genitals and reproductive organs. The first pelvic exam should be at age 41. Pelvic exams allow your health care provider to check on normal development and screen for health problems. Usually, a general physical exam is done first. An exam of the breasts is also done. At this visit, you can ask questions about your health, body, menstrual cycles, sex, and birth control methods. Your health care provider will also ask you questions about your health, family health, menstrual periods, immunizations, and if you are sexually active. The information shared between you and your health care provider is not shared with anyone else. WHAT ARE THE REASONS TO HAVE A PELVIC EXAM? One reason for a pelvic exam is to screen for cancer of the ovaries, uterus, and vagina (pelvic organs). Annual (once a year) pelvic exams to screen for cancer are no longer recommended for nonpregnant women who are considered low risk for cancer of the pelvic organs and who do not have symptoms. These are sometimes called Pap tests. Ask your health care provider if a screening pelvic exam is right for you. For low-risk women, pelvic exam cancer screening should be done:  Every 3 years, for women ages 21-29.  Every 5 years, for women ages 42-65.  Some women have medical problems that increase the chance of getting cervical cancer. Talk to your health care provider about these problems. It is especially important to talk to your health care provider if a new problem develops soon after your last Pap test. In these cases, your health care provider may recommend more frequent screening and Pap tests.  The above recommendations are the same for women who have or have not gotten the vaccine for HPV, or human papillomavirus.  If you had a complete hysterectomy for a problem that was not cancer or a condition that could lead to cancer, then you no longer need Pap tests. However,  even if you no longer need a Pap test, a regular exam is a good idea to make sure no other problems are starting.   If you have had past treatment for cervical cancer or a condition that could lead to cancer, you need annual Pap tests and screening for cancer for at least 20 years after your treatment.  If you are no longer receiving Pap tests, risk factors (such as a new sexual partner)need to be discussed with your health care provider to determine if screening should be started again. Other reasons your health care provider might recommend a pelvic exam could include:   If you are at high risk for cervical cancer.  To make sure your female organs are normal and functioning correctly.  To check on body changes that suggest a reproductive system cancer.  To explore why you are not able to get pregnant (infertility).  To find a cause for vaginal discharge, itching, or burning.  To look for causes of why you cannot hold your urine (urinary incontinence).  To look for causes of sexual problems.  To look for signs of sexually transmitted infection (STI).  To follow the progression of labor. Your health care provider can check on the baby and how far your cervix has opened.  To determine if pregnancy is present or how far advanced the pregnancy is.  If you have:  severe cramping or pain during your menstrual periods.  pain during sexual intercourse.  abnormal menstrual periods.  no menstrual period by the age  of 16. HOW IS A PELVIC EXAM PERFORMED?  A pelvic exam is usually painless but may cause mild discomfort. In unusual circumstances or in young girls, medicines may be used for comfort. A pelvic exam is not done routinely before a girl is sexually active. Special circumstances such as rape, trauma, or medical problems may require an exam. Below is what you can expect during a pelvic exam.  You will remove all your clothes and will be given a gown. Usually, there is a nurse  in the room during the exam and you can have someone from your family with you also.  The general physical exam will be done first.  Before the pelvic exam starts, you will lie down on your back on a special table. You will put the heels of your feet into foot rests (stirrups) with your legs apart. A gown, cloth, or paper drape is usually placed over your abdomen and legs.  First, the health care provider checks your outer genitals to make sure the arrangement of body parts is normal. This includes the clitoris, vaginal opening, hymen, labia, and the perineal area between the vagina and rectum. The labia are the skin folds surrounding the vaginal opening. The tube that carries urine (urethra) is also examined.  An internal exam is done next. First, the health care provider inserts an instrument called a speculum into the vagina. The speculum has lubricant on it. The speculum helps hold the vaginal walls apart. The health care provider can then examine the vagina and cervix, which is the opening to the uterus. Cultures of any discharge may be taken to check for an infection. A Pap test may be done.  After the internal exam is done, the speculum is removed. Your health care provider will use latex gloves with a lubricant on his or her fingers to gently press against various pelvic organs from inside the vagina while his or her other hand is on your lower belly. Your health care provider will note any tenderness or abnormalities.  Following the exam, you will get dressed and can speak with your health care provider.  Ask your health care provider when and how often you should return for future visits. Finding Out the Results of Your Test Ask when your test results will be ready. Make sure you get your test results. HOW DO I CONTINUE A HEALTHY LIFESTYLE?  Follow your health care provider's advice regarding follow-up and future visits.  Get the necessary immunizations according to your age and any  traveling you may do.  Eat a balanced, nourishing diet.  Get plenty of rest and sleep.  Exercise regularly.  Maintain a healthy weight.  Do not smoke or take illegal drugs.  Drink alcohol in moderation or not at all.  If you are sexually active, use some form of birth control if you do not plan to get pregnant.  If you are sexually active, practice safe sex by using a condom to protect against sexually transmitted disease (STD).  Get help or counseling if you have emotional problems. Document Released: 04/05/2002 Document Revised: 01/18/2013 Document Reviewed: 04/11/2009 [facility] Patient Information 2015 [place], [place]. This information is not intended to replace advice given to you by your health care provider. Make sure you discuss any questions you have with your health care provider.

## 2014-05-06 LAB — HEPATITIS C ANTIBODY: HCV AB: NEGATIVE

## 2014-05-06 LAB — GC/CHLAMYDIA PROBE AMP
CT PROBE, AMP APTIMA: NEGATIVE
GC Probe RNA: NEGATIVE

## 2014-05-06 LAB — RPR

## 2014-05-06 LAB — HEPATITIS B SURFACE ANTIGEN: Hepatitis B Surface Ag: NEGATIVE

## 2014-05-06 LAB — HIV ANTIBODY (ROUTINE TESTING W REFLEX): HIV: NONREACTIVE

## 2014-05-08 ENCOUNTER — Telehealth: Payer: Self-pay | Admitting: General Practice

## 2014-05-08 NOTE — Telephone Encounter (Signed)
Called patient and informed her of results, antibiotic sent to pharmacy and to avoid alcohol while on the medication. Patient verbalized understanding and asked if this would be fine with her birth control pill. Told patient yes. Patient verbalized understanding and had no other questions

## 2014-05-08 NOTE — Telephone Encounter (Signed)
-----   Message from Luvenia Redden, PA-C sent at 05/05/2014  3:16 PM EDT ----- Patient has BV. Rx for Flagyl sent to patient's pharmacy. Please call patient and tell her about dx and Rx.   Thanks! Almyra Free

## 2014-05-12 ENCOUNTER — Ambulatory Visit (HOSPITAL_COMMUNITY)
Admission: RE | Admit: 2014-05-12 | Discharge: 2014-05-12 | Disposition: A | Discharge status: Home / Self Care | Payer: No Typology Code available for payment source | Source: Ambulatory Visit | Attending: Medical | Admitting: Medical

## 2014-05-12 ENCOUNTER — Other Ambulatory Visit: Payer: Self-pay | Admitting: Medical

## 2014-05-12 DIAGNOSIS — Z1231 Encounter for screening mammogram for malignant neoplasm of breast: Secondary | ICD-10-CM | POA: Diagnosis present

## 2014-05-12 DIAGNOSIS — Z01419 Encounter for gynecological examination (general) (routine) without abnormal findings: Secondary | ICD-10-CM

## 2015-04-13 ENCOUNTER — Other Ambulatory Visit: Payer: Self-pay

## 2015-04-13 DIAGNOSIS — Z1231 Encounter for screening mammogram for malignant neoplasm of breast: Secondary | ICD-10-CM

## 2015-05-15 ENCOUNTER — Ambulatory Visit: Payer: No Typology Code available for payment source

## 2015-06-19 ENCOUNTER — Ambulatory Visit
Admission: RE | Admit: 2015-06-19 | Discharge: 2015-06-19 | Disposition: A | Discharge status: Home / Self Care | Payer: No Typology Code available for payment source | Source: Ambulatory Visit

## 2015-06-19 DIAGNOSIS — Z1231 Encounter for screening mammogram for malignant neoplasm of breast: Secondary | ICD-10-CM

## 2015-09-17 ENCOUNTER — Ambulatory Visit: Payer: No Typology Code available for payment source | Admitting: Medical

## 2016-05-06 ENCOUNTER — Other Ambulatory Visit: Payer: Self-pay | Admitting: Medical

## 2016-05-06 DIAGNOSIS — Z1231 Encounter for screening mammogram for malignant neoplasm of breast: Secondary | ICD-10-CM

## 2016-06-13 ENCOUNTER — Ambulatory Visit
Admission: RE | Admit: 2016-06-13 | Discharge: 2016-06-13 | Disposition: A | Discharge status: Home / Self Care | Payer: BLUE CROSS/BLUE SHIELD | Source: Ambulatory Visit | Attending: Medical | Admitting: Medical

## 2016-06-13 DIAGNOSIS — Z1231 Encounter for screening mammogram for malignant neoplasm of breast: Secondary | ICD-10-CM

## 2016-09-02 ENCOUNTER — Ambulatory Visit (INDEPENDENT_AMBULATORY_CARE_PROVIDER_SITE_OTHER): Payer: BLUE CROSS/BLUE SHIELD | Admitting: General Practice

## 2016-09-02 DIAGNOSIS — N898 Other specified noninflammatory disorders of vagina: Secondary | ICD-10-CM

## 2016-09-02 DIAGNOSIS — Z113 Encounter for screening for infections with a predominantly sexual mode of transmission: Secondary | ICD-10-CM | POA: Diagnosis not present

## 2016-09-02 NOTE — Progress Notes (Signed)
Patient here today for vaginal discharge with odor for past 2 weeks. Patient states she was itching but isn't anymore. Patient instructed in self swab and that we will contact her with results. Patient asked questions about trying to get pregnant. Instructed patient to keep track of periods & ovulation window. Discussed having intercourse every other day during ovulation window & starting prenatal vitamins. Patient verbalized understanding & had no questions

## 2016-09-03 LAB — CERVICOVAGINAL ANCILLARY ONLY
BACTERIAL VAGINITIS: POSITIVE — AB
Candida vaginitis: POSITIVE — AB
Trichomonas: NEGATIVE

## 2016-09-04 ENCOUNTER — Other Ambulatory Visit: Payer: Self-pay | Admitting: Family Medicine

## 2016-09-04 ENCOUNTER — Encounter: Payer: Self-pay | Admitting: Family Medicine

## 2016-09-04 DIAGNOSIS — B9689 Other specified bacterial agents as the cause of diseases classified elsewhere: Secondary | ICD-10-CM

## 2016-09-04 DIAGNOSIS — N760 Acute vaginitis: Principal | ICD-10-CM

## 2016-09-04 MED ORDER — METRONIDAZOLE 500 MG PO TABS: 500.0000 mg | ORAL_TABLET | Freq: Two times a day (BID) | ORAL | 0 refills | Status: DC

## 2016-09-04 MED ORDER — MICONAZOLE NITRATE 2 % VA CREA: 1.0000 | TOPICAL_CREAM | Freq: Every day | VAGINAL | 2 refills | Status: DC

## 2016-10-06 ENCOUNTER — Ambulatory Visit (INDEPENDENT_AMBULATORY_CARE_PROVIDER_SITE_OTHER): Payer: BLUE CROSS/BLUE SHIELD

## 2016-10-06 DIAGNOSIS — Z3202 Encounter for pregnancy test, result negative: Secondary | ICD-10-CM

## 2016-10-06 LAB — POCT PREGNANCY, URINE: Preg Test, Ur: NEGATIVE

## 2016-10-06 NOTE — Progress Notes (Signed)
Patient presented to the office for a UPT. UPT negative. Patient stated that she had her period on 8/1 and she took a plan B on 8/18. Patient states that she was here last month for BV and yeast. Patient states that she finished her antibiotics but never picked up her Monistat 7.  I called the pharmacy to make sure the patient's Monistat 7 is available.Advised patient to pick up the cream and to call the office to make an appointment if she misses her next cycle.Patient verbalized understanding and had no questions.

## 2017-10-08 ENCOUNTER — Encounter (HOSPITAL_COMMUNITY): Payer: Self-pay | Admitting: Emergency Medicine

## 2017-10-08 ENCOUNTER — Ambulatory Visit (HOSPITAL_COMMUNITY)
Admission: EM | Admit: 2017-10-08 | Discharge: 2017-10-08 | Disposition: A | Discharge status: Home / Self Care | Payer: BLUE CROSS/BLUE SHIELD | Attending: Family Medicine | Admitting: Family Medicine

## 2017-10-08 DIAGNOSIS — R300 Dysuria: Secondary | ICD-10-CM

## 2017-10-08 DIAGNOSIS — R350 Frequency of micturition: Secondary | ICD-10-CM | POA: Diagnosis present

## 2017-10-08 DIAGNOSIS — M545 Low back pain: Secondary | ICD-10-CM | POA: Diagnosis not present

## 2017-10-08 DIAGNOSIS — R319 Hematuria, unspecified: Secondary | ICD-10-CM

## 2017-10-08 DIAGNOSIS — Z833 Family history of diabetes mellitus: Secondary | ICD-10-CM | POA: Diagnosis not present

## 2017-10-08 DIAGNOSIS — M549 Dorsalgia, unspecified: Secondary | ICD-10-CM | POA: Diagnosis present

## 2017-10-08 DIAGNOSIS — Z8249 Family history of ischemic heart disease and other diseases of the circulatory system: Secondary | ICD-10-CM | POA: Diagnosis not present

## 2017-10-08 DIAGNOSIS — Z3202 Encounter for pregnancy test, result negative: Secondary | ICD-10-CM | POA: Diagnosis not present

## 2017-10-08 LAB — POCT URINALYSIS DIP (DEVICE)
Bilirubin Urine: NEGATIVE
GLUCOSE, UA: NEGATIVE mg/dL
Ketones, ur: NEGATIVE mg/dL
Leukocytes, UA: NEGATIVE
NITRITE: NEGATIVE
PROTEIN: NEGATIVE mg/dL
SPECIFIC GRAVITY, URINE: 1.01 (ref 1.005–1.030)
UROBILINOGEN UA: 0.2 mg/dL (ref 0.0–1.0)
pH: 7 (ref 5.0–8.0)

## 2017-10-08 LAB — POCT PREGNANCY, URINE: Preg Test, Ur: NEGATIVE

## 2017-10-08 MED ORDER — IBUPROFEN 800 MG PO TABS: 800.0000 mg | ORAL_TABLET | Freq: Three times a day (TID) | ORAL | 0 refills | Status: DC

## 2017-10-08 NOTE — ED Triage Notes (Signed)
Pt here for mid to lower back pain and dysuria x 1 week; pt sts hx of similar

## 2017-10-08 NOTE — Discharge Instructions (Addendum)
Drink plenty of water I have sent the urine specimen for additional testing, culture.  I am checking for infection.  You have a small amount of blood in your urine.  This could be from infection or from a stone.  I recommend you follow-up with your gynecologist as scheduled.  If you have worsening back and flank pain, or additional blood in your urine you may need to go to the emergency room for additional testing.  We cannot test for kidney stones at the urgent care center. I recommend ibuprofen 3 times a day with food.  This is for the back pain.

## 2017-10-08 NOTE — ED Provider Notes (Signed)
[place]    CSN: [id number] Arrival date & time: 10/08/17  1753     History   Chief Complaint Chief Complaint  Patient presents with  . Back Pain    appt 1800  . Dysuria    HPI Sheila Weber is a 42 y.o. female.   HPI  Patient states she saw her GYN 2 to 3 weeks ago.  She had dysuria.  She was told she had a bladder infection.  She was treated with 1 week of antibiotics.  She states she felt better briefly.  Now the pain is back.  She is seeing some blood in her urine.  She has urinary frequency.  She complains of pain in the middle back.  No nausea or vomiting.  No fever or chills.  She is never had kidney stones or kidney infections before.  She does not feel like she is pregnant.  Her pregnancy test was negative at her last visit.  She has not had a menstrual period since June.  She is scheduled to go back to her gynecologist for additional testing.  She is compliant with her birth control pills.  History reviewed. No pertinent past medical history.  There are no active problems to display for this patient.   Past Surgical History:  Procedure Laterality Date  . CESAREAN SECTION      OB History    Gravida  2   Para  2   Term  2   Preterm      AB      Living  2     SAB      TAB      Ectopic      Multiple      Live Births               Home Medications    Prior to Admission medications   Medication Sig Start Date End Date Taking? Authorizing Provider  ibuprofen (ADVIL,MOTRIN) 800 MG tablet Take 1 tablet (800 mg total) by mouth 3 (three) times daily. 10/08/17   Raylene Everts, MD  norgestimate-ethinyl estradiol (ORTHO-CYCLEN,SPRINTEC,PREVIFEM) 0.25-35 MG-MCG tablet Take 1 tablet by mouth daily. 05/05/14   Luvenia Redden, PA-C    Family History Family History  Problem Relation Age of Onset  . Hypertension Mother   . Asthma Mother   . Diabetes Father     Social History Social History   Tobacco Use  . Smoking status:  Never Smoker  . Smokeless tobacco: Never Used  Substance Use Topics  . Alcohol use: No  . Drug use: No     Allergies   Patient has no known allergies.   Review of Systems Review of Systems  Constitutional: Negative for chills and fever.  HENT: Negative for ear pain and sore throat.   Eyes: Negative for pain and visual disturbance.  Respiratory: Negative for cough and shortness of breath.   Cardiovascular: Negative for chest pain and palpitations.  Gastrointestinal: Negative for abdominal pain, nausea and vomiting.  Genitourinary: Positive for dysuria, flank pain, frequency and hematuria. Negative for difficulty urinating, vaginal bleeding and vaginal discharge.  Musculoskeletal: Negative for arthralgias and back pain.  Skin: Negative for color change and rash.  Neurological: Negative for seizures and syncope.  All other systems reviewed and are negative.    Physical Exam Triage Vital Signs ED Triage Vitals [10/08/17 1826]  Enc Vitals Group     BP 124/72     Pulse Rate 98  Resp 18     Temp 98.5 F (36.9 C)     Temp Source Oral     SpO2 96 %   No data found.  Updated Vital Signs BP 124/72 (BP Location: Left Arm)   Pulse 98   Temp 98.5 F (36.9 C) (Oral)   Resp 18   SpO2 96%       Physical Exam  Constitutional: She appears well-developed and well-nourished. No distress.  HENT:  Head: Normocephalic and atraumatic.  Mouth/Throat: Oropharynx is clear and moist.  Eyes: Pupils are equal, round, and reactive to light. Conjunctivae are normal.  Neck: Normal range of motion.  Cardiovascular: Normal rate, regular rhythm and normal heart sounds.  Pulmonary/Chest: Effort normal and breath sounds normal. No respiratory distress.  Abdominal: Soft. Bowel sounds are normal. She exhibits no distension. There is no tenderness.  Musculoskeletal: Normal range of motion. She exhibits no edema.  Mild tenderness in the middle to lower back over the paraspinous muscles T11 to  SI bilaterally.  No increased pain with CVA tapping.  Neurological: She is alert.  Skin: Skin is warm and dry.  Psychiatric: She has a normal mood and affect. Her behavior is normal.     UC Treatments / Results  Labs (all labs ordered are listed, but only abnormal results are displayed) Labs Reviewed  POCT URINALYSIS DIP (DEVICE) - Abnormal; Notable for the following components:      Result Value   Hgb urine dipstick TRACE (*)    All other components within normal limits  URINE CULTURE  POCT PREGNANCY, URINE    EKG None  Radiology No results found.  Procedures Procedures (including critical care time)  Medications Ordered in UC Medications - No data to display  Initial Impression / Assessment and Plan / UC Course  I have reviewed the triage vital signs and the nursing notes.  Pertinent labs & imaging results that were available during my care of the patient were reviewed by me and considered in my medical decision making (see chart for details).    Pregnancy test negative.  Urinalysis is reasonably normal with just a trace of hematuria.  With a history of gross hematuria and flank pain I wonder if she has a kidney stone.  We will culture her urine to make sure that there is not another bladder infection.  I told her she may have a kidney stone and if worse needs to see her PCP or go to the ER  Final Clinical Impressions(s) / UC Diagnoses   Final diagnoses:  Hematuria, unspecified type     Discharge Instructions     Drink plenty of water I have sent the urine specimen for additional testing, culture.  I am checking for infection.  You have a small amount of blood in your urine.  This could be from infection or from a stone.  I recommend you follow-up with your gynecologist as scheduled.  If you have worsening back and flank pain, or additional blood in your urine you may need to go to the emergency room for additional testing.  We cannot test for kidney stones at the  urgent care center. I recommend ibuprofen 3 times a day with food.  This is for the back pain.    ED Prescriptions    Medication Sig Dispense Auth. Provider   ibuprofen (ADVIL,MOTRIN) 800 MG tablet Take 1 tablet (800 mg total) by mouth 3 (three) times daily. 21 tablet Raylene Everts, MD     Controlled  Substance Prescriptions [place] Controlled Substance Registry consulted? Not Applicable   Raylene Everts, MD 10/08/17 [phone]

## 2017-10-11 LAB — URINE CULTURE: Culture: >=100000 — AB

## 2017-10-12 ENCOUNTER — Telehealth (HOSPITAL_COMMUNITY): Payer: Self-pay

## 2017-10-12 MED ORDER — IBUPROFEN 800 MG PO TABS: 800.0000 mg | ORAL_TABLET | Freq: Three times a day (TID) | ORAL | 0 refills | Status: DC

## 2017-10-12 MED ORDER — NITROFURANTOIN MONOHYD MACRO 100 MG PO CAPS
100.0000 mg | ORAL_CAPSULE | Freq: Two times a day (BID) | ORAL | 0 refills | Status: DC
Start: 2017-10-12 — End: 2017-11-12

## 2017-10-12 MED ORDER — NITROFURANTOIN MONOHYD MACRO 100 MG PO CAPS: 100.0000 mg | ORAL_CAPSULE | Freq: Two times a day (BID) | ORAL | 0 refills | Status: DC

## 2017-10-12 NOTE — Telephone Encounter (Signed)
Urine culture positive for E.coli this was not treated at ucc visit. Rx for Macrobid 100 mg bid x 5 days sent to pharmacy of choice. Attempted to reach patient. No answer. Message sent to mychart.

## 2017-11-12 ENCOUNTER — Ambulatory Visit (INDEPENDENT_AMBULATORY_CARE_PROVIDER_SITE_OTHER): Payer: BLUE CROSS/BLUE SHIELD | Admitting: Family Medicine

## 2017-11-12 ENCOUNTER — Encounter: Payer: Self-pay | Admitting: Family Medicine

## 2017-11-12 ENCOUNTER — Encounter (HOSPITAL_COMMUNITY): Payer: Self-pay

## 2017-11-12 ENCOUNTER — Ambulatory Visit (HOSPITAL_COMMUNITY)
Admission: RE | Admit: 2017-11-12 | Discharge: 2017-11-12 | Disposition: A | Discharge status: Home / Self Care | Payer: BLUE CROSS/BLUE SHIELD | Source: Ambulatory Visit | Attending: Family Medicine | Admitting: Family Medicine

## 2017-11-12 VITALS — BP 131/72 | HR 83 | Temp 98.2°F | Resp 17 | Ht 66.0 in | Wt 172.2 lb

## 2017-11-12 DIAGNOSIS — Z130 Encounter for screening for diseases of the blood and blood-forming organs and certain disorders involving the immune mechanism: Secondary | ICD-10-CM

## 2017-11-12 DIAGNOSIS — Z1331 Encounter for screening for depression: Secondary | ICD-10-CM

## 2017-11-12 DIAGNOSIS — R202 Paresthesia of skin: Secondary | ICD-10-CM

## 2017-11-12 DIAGNOSIS — Z23 Encounter for immunization: Secondary | ICD-10-CM

## 2017-11-12 DIAGNOSIS — Z13220 Encounter for screening for lipoid disorders: Secondary | ICD-10-CM

## 2017-11-12 DIAGNOSIS — M5441 Lumbago with sciatica, right side: Secondary | ICD-10-CM

## 2017-11-12 DIAGNOSIS — M5442 Lumbago with sciatica, left side: Secondary | ICD-10-CM

## 2017-11-12 DIAGNOSIS — M25511 Pain in right shoulder: Secondary | ICD-10-CM

## 2017-11-12 DIAGNOSIS — K219 Gastro-esophageal reflux disease without esophagitis: Secondary | ICD-10-CM

## 2017-11-12 DIAGNOSIS — R200 Anesthesia of skin: Secondary | ICD-10-CM

## 2017-11-12 DIAGNOSIS — Z131 Encounter for screening for diabetes mellitus: Secondary | ICD-10-CM | POA: Diagnosis not present

## 2017-11-12 MED ORDER — MELOXICAM 15 MG PO TABS: 15.0000 mg | ORAL_TABLET | Freq: Every day | ORAL | 2 refills | Status: DC | PRN

## 2017-11-12 MED ORDER — CYCLOBENZAPRINE HCL 10 MG PO TABS: 10.0000 mg | ORAL_TABLET | Freq: Three times a day (TID) | ORAL | 0 refills | Status: DC | PRN

## 2017-11-12 NOTE — Progress Notes (Addendum)
Sheila Weber, is a 42 y.o. female  RXV:[id number]  JKD:[id number]  DOB - 10/22/1975  CC:  Chief Complaint  Patient presents with  . Establish Care  . Numbness    numbness & tingling in her R arm & leg x months. no known injury.       HPI: Sheila Weber is a 42 y.o. female is here today to establish care.   Mieke Mckeithan does not have any significant medical history.    Today's visit:   Parenthesia Complains of generalized numbness and tingling hand, feet, and legs. At present, her right arm and hand is weak and she is dropping items.  Right hand weakness started about a few months ago. No provoking factor identified.  She has right shoulder pain which has been present for "a long time". No injury. Takes ibuprofen without relief.  Low Back pain Patient presents for presents evaluation of lower back problems.  Symptoms have been present for several years and include pain in aching and sharp with moderate to severe pain. Initial inciting event none. She works in [employer] which requires lifting and prolonged standing. Attempted relief with ibuprofen and tylenol without improvement. Exacerbating factors identifiable by patient are standing, walking, and changing positions. No prior imaging on file.  Headaches Patient complains of headache, frontal, occurring 2-3 times per week. Headaches occur mostly in the morning. Reports good sleep. Admits to stress (would not elaborate as to source of stress). Headaches resolve without treatment or ibuprofen. Denies URI symptoms or allergies. No history of head injury.  Indigestion Burning sensation in chest occurring late.  Unable to associate with any specific food. Meals which are heavy later at night, cause worsening of symptoms. Diet is high intake of spicy foods. Consumes spicy food at night. Denies nausea,vomiting, cough, burning in throat. She has not attempted any relief with any medications.   Patient denies chest pain, abdominal pain,  nausea, new weakness , numbness or tingling, SOB, edema, or worrisome cough.   Current medications: Current Outpatient Medications:  .  ibuprofen (ADVIL,MOTRIN) 200 MG tablet, Take 200-400 mg by mouth as needed., Disp: , Rfl:    Pertinent family medical history: family history includes Asthma in her mother; Diabetes in her father; Hypertension in her mother. Hypertension diabetes and abnormal heart ICD heart failure   No Known Allergies  Social History   Socioeconomic History  . Marital status: Single    Spouse name: Not on file  . Number of children: Not on file  . Years of education: Not on file  . Highest education level: Not on file  Occupational History  . Not on file  Social Needs  . Financial resource strain: Not on file  . Food insecurity:    Worry: Not on file    Inability: Not on file  . Transportation needs:    Medical: Not on file    Non-medical: Not on file  Tobacco Use  . Smoking status: Never Smoker  . Smokeless tobacco: Never Used  Substance and Sexual Activity  . Alcohol use: No  . Drug use: No  . Sexual activity: Yes    Birth control/protection: None  Lifestyle  . Physical activity:    Days per week: Not on file    Minutes per session: Not on file  . Stress: Not on file  Relationships  . Social connections:    Talks on phone: Not on file    Gets together: Not on file    Attends religious service: Not on  file    Active member of club or organization: Not on file    Attends meetings of clubs or organizations: Not on file    Relationship status: Not on file  . Intimate partner violence:    Fear of current or ex partner: Not on file    Emotionally abused: Not on file    Physically abused: Not on file    Forced sexual activity: Not on file  Other Topics Concern  . Not on file  Social History Narrative  . Not on file    Review of Systems: Constitutional: Negative for fever, chills, diaphoresis, activity change, appetite change and  fatigue. Eyes: Negative for pain, discharge, redness, itching and visual disturbance. Respiratory: Negative for cough, choking, chest tightness, shortness of breath, wheezing and stridor.  Cardiovascular: Negative for chest pain, palpitations and leg swelling. Gastrointestinal: Negative for abdominal distention. Musculoskeletal: Positive back pain, arthralgia and negative gait problem. Neurological: Negative for dizziness, tremors, seizures, syncope, facial asymmetry, speech difficulty, weakness, light-headedness. Positive for numbness,tingling  and headaches.  Hematological: Negative for adenopathy. Does not bruise/bleed easily. Psychiatric/Behavioral: Positive stress and anxiety. Negative behavioral problems, confusion, dysphoric mood, decreased concentration and agitation.  Objective:   Vitals:   11/12/17 1020  BP: 131/72  Pulse: 83  Resp: 17  Temp: 98.2 F (36.8 C)  SpO2: 99%    BP Readings from Last 3 Encounters:  11/12/17 131/72  10/08/17 124/72  05/05/14 129/64    Filed Weights   11/12/17 1020  Weight: 172 lb 3.2 oz (78.1 kg)      Physical Exam: Constitutional: Patient appears well-developed and well-nourished. No distress. HENT: Normocephalic, atraumatic, External right and left ear normal. Oropharynx is clear and moist.  Eyes: Conjunctivae and EOM are normal. PERRLA, no scleral icterus. Neck: Normal ROM. Neck supple. No JVD. No tracheal deviation. No thyromegaly. CVS: RRR, S1/S2 +, no murmurs, no gallops, no carotid bruit.  Pulmonary: Effort and breath sounds normal, no stridor, rhonchi, wheezes, rales.  Abdominal: Soft. BS +, no distension, tenderness, rebound or guarding.  Musculoskeletal: Back:No spinous tenderness. Negative SLR.  Right shoulder: pain with internal and external rotation. Tenderness present AC joint. No crepitus noted. Neuro: Alert. Normal gait. RUE 4/5 and LUE 5/5 Bilateral hand grips asymmetric. R hand weakness noted. No cranial nerve deficits  noted. Skin: Skin is warm and dry. No rash noted. Not diaphoretic. No erythema. No pallor. Psychiatric: Normal mood and affect. Behavior, judgment, thought content normal.  Assessment and plan:  1. Acute bilateral low back pain with bilateral sciatica, exam finding unremarkable, however patient has right arm weakness which I suspect is related to shoulder pain vs back pain. Given that this is a  longstanding problem, I will obtain a lumber spine complete and refer to Orthopedic Surgery. Will trial Meloxicam 15 mg daily and cyclobenzaprine 10 mg up to 3 times daily.  2. Screening, lipid, fasting  - Thyroid Panel With TSH; Future - Lipid panel; Future  3. Bilateral numbness and tingling of arms and legs, idiopathic.  Obtaining vitamin B12, CBC with Differential,  Comprehensive metabolic panel  4. Bilateral hand numbness idiopathic.  Obtaining vitamin B12, CBC with Differential,  Comprehensive metabolic panel  5. Screening for diabetes mellitus Hemoglobin A1C pending  6. Pain in joint of right shoulder, questionable impingement syndrome given right arm, right hand weakness and paraesthesia. Obtaining right shoulder image and referring to orthopedics.   Start Meloxicam 15 mg daily   7. Screening for deficiency anemia - Iron, TIBC and Ferritin  Panel; Future  8. Flu vaccine need, administered.  9. Positive depression screening,  -referring to social work.  10. GERD,  trial otc Pepcid as needed. If symptoms remain, will start PPI.    Return Tuesday lab appt and 4 weeks f/u me.   Meds ordered this encounter  Medications  . meloxicam (MOBIC) 15 MG tablet    Sig: Take 1 tablet (15 mg total) by mouth daily as needed for pain.    Dispense:  30 tablet    Refill:  2  . cyclobenzaprine (FLEXERIL) 10 MG tablet    Sig: Take 1 tablet (10 mg total) by mouth 3 (three) times daily as needed for muscle spasms (medication may cause drowsiness).    Dispense:  30 tablet    Refill:  0   Orders  Placed This Encounter  Procedures  . DG Lumbar Spine Complete    Standing Status:   Future    Number of Occurrences:   1    Standing Expiration Date:   01/13/2019    Order Specific Question:   Reason for Exam (SYMPTOM  OR DIAGNOSIS REQUIRED)    Answer:   low back pain with sciatica with bilateral lower extremity pain    Order Specific Question:   Is patient pregnant?    Answer:   No    Order Specific Question:   Preferred imaging location?    Answer:   External    Order Specific Question:   Radiology Contrast Protocol - do NOT remove file path    Answer:   \\[email or web address]\DXFluoroContrastProtocols.pdf  . DG Shoulder Right    Standing Status:   Future    Number of Occurrences:   1    Standing Expiration Date:   01/13/2019    Order Specific Question:   Reason for Exam (SYMPTOM  OR DIAGNOSIS REQUIRED)    Answer:   left shoulder pain    Order Specific Question:   Is patient pregnant?    Answer:   No    Order Specific Question:   Preferred imaging location?    Answer:   External    Order Specific Question:   Call Results- Best Contact Number?    Answer:   [phone]    Order Specific Question:   Radiology Contrast Protocol - do NOT remove file path    Answer:   \\[email or web address]\Radiant\DXFluoroContrastProtocols.pdf  . Flu Vaccine QUAD 6+ mos PF IM (Fluarix Quad PF)  . Vitamin B12    Standing Status:   Future    Standing Expiration Date:   11/13/2018  . Iron, TIBC and Ferritin Panel    Standing Status:   Future    Standing Expiration Date:   11/13/2018  . CBC with Differential    Standing Status:   Future    Standing Expiration Date:   11/13/2018  . Comprehensive metabolic panel    Standing Status:   Future    Standing Expiration Date:   11/13/2018    Order Specific Question:   Has the patient fasted?    Answer:   No  . Thyroid Panel With TSH    Standing Status:   Future    Standing Expiration Date:   11/13/2018  . Lipid panel    Standing Status:   Future     Standing Expiration Date:   11/13/2018    Order Specific Question:   Has the patient fasted?    Answer:   No  . Hemoglobin A1c    Standing Status:   Future  Standing Expiration Date:   11/16/2018  . Ambulatory referral to Orthopedic Surgery    Referral Priority:   Routine    Referral Type:   Surgical    Referral Reason:   Specialty Services Required    Requested Specialty:   Orthopedic Surgery    Number of Visits Requested:   3     The patient was given clear instructions to go to ER or return to medical center if symptoms don't improve, worsen or new problems develop. The patient verbalized understanding. The patient was advised  to call and obtain lab results if they haven't heard anything from out office within 7-10 business days.  Molli Barrows, FNP Primary Care at [facility] [address], [place] [phone]: [phone]  A total of 50 minutes spent, greater than 50 % of this time was spent counseling and coordination of care.   This note has been created with DSurveyor, quantity Any transcriptional errors are unintentional.

## 2017-11-12 NOTE — Patient Instructions (Addendum)
Go to [facility] through the main entrance request radiology to obtain imaging today.  [facility] their address is [address] [place], [place]. will schedule a lab visit prior to leaving today.  You have been referred to orthopedics for your right shoulder pain and back pain.  You will be contacted to schedule an appointment.   Thank you for choosing Primary Care at [facility] for your medical home!    Sheila Weber was seen by Molli Barrows, FNP today.   Sheila Weber's primary care  is Scot Jun, FNP.   For the best care possible,  you should try to see Molli Barrows, FNP  whenever you come to clinic.   We look forward to seeing you again soon!  If you have any questions about your visit today,  please call us at   Or feel free to reach your provider via [place].      Paresthesia Paresthesia is a burning or prickling feeling. This feeling can happen in any part of the body. It often happens in the hands, arms, legs, or feet. Usually, it is not painful. In most cases, the feeling goes away in a short time and is not a sign of a serious problem. Follow these instructions at home:  Avoid drinking alcohol.  Try massage or needle therapy (acupuncture) to help with your problems.  Keep all follow-up visits as told by your doctor. This is important. Contact a doctor if:  You keep on having episodes of paresthesia.  Your burning or prickling feeling gets worse when you walk.  You have pain or cramps.  You feel dizzy.  You have a rash. Get help right away if:  You feel weak.  You have trouble walking or moving.  You have problems speaking, understanding, or seeing.  You feel confused.  You cannot control when you pee (urinate) or poop (bowel movement).  You lose feeling (numbness) after an injury.  You pass out (faint). This information is not intended to replace advice given to  you by your health care provider. Make sure you discuss any questions you have with your health care provider. Document Released: 12/27/2007 Document Revised: 06/21/2015 Document Reviewed: 01/09/2014 Elsevier Interactive Patient Education  2018 [employer].    Chronic Back Pain When back pain lasts longer than 3 months, it is called chronic back pain.The cause of your back pain may not be known. Some common causes include:  Wear and tear (degenerative disease) of the bones, ligaments, or disks in your back.  Inflammation and stiffness in your back (arthritis).  People who have chronic back pain often go through certain periods in which the pain is more intense (flare-ups). Many people can learn to manage the pain with home care. Follow these instructions at home: Pay attention to any changes in your symptoms. Take these actions to help with your pain: Activity  Avoid bending and activities that make the problem worse.  Do not sit or stand in one place for long periods of time.  Take brief periods of rest throughout the day. This will reduce your pain. Resting in a lying or standing position is usually better than sitting to rest.  When you are resting for longer periods, mix in some mild activity or stretching between periods of rest. This will help to prevent stiffness and pain.  Get regular exercise. Ask your health care provider what activities are safe for you.  Do  not lift anything that is heavier than 10 lb (4.5 kg). Always use proper lifting technique, which includes: ? Bending your knees. ? Keeping the load close to your body. ? Avoiding twisting. Managing pain  If directed, apply ice to the painful area. Your health care provider may recommend applying ice during the first 24-48 hours after a flare-up begins. ? Put ice in a plastic bag. ? Place a towel between your skin and the bag. ? Leave the ice on for 20 minutes, 2-3 times per day.  After icing, apply heat to  the affected area as often as told by your health care provider. Use the heat source that your health care provider recommends, such as a moist heat pack or a heating pad. ? Place a towel between your skin and the heat source. ? Leave the heat on for 20-30 minutes. ? Remove the heat if your skin turns bright red. This is especially important if you are unable to feel pain, heat, or cold. You may have a greater risk of getting burned.  Try soaking in a warm tub.  Take over-the-counter and prescription medicines only as told by your health care provider.  Keep all follow-up visits as told by your health care provider. This is important. Contact a health care provider if:  You have pain that is not relieved with rest or medicine. Get help right away if:  You have weakness or numbness in one or both of your legs or feet.  You have trouble controlling your bladder or your bowels.  You have nausea or vomiting.  You have pain in your abdomen.  You have shortness of breath or you faint. This information is not intended to replace advice given to you by your health care provider. Make sure you discuss any questions you have with your health care provider. Document Released: 02/21/2004 Document Revised: 05/24/2015 Document Reviewed: 07/03/2014 Elsevier Interactive Patient Education  2018 [employer].

## 2017-11-13 NOTE — Telephone Encounter (Signed)
Patient called and was informed of PCP's message, patient confirmed verbally her understanding and will pick up medications today. Patient had no further questions or concerns.

## 2017-11-17 ENCOUNTER — Ambulatory Visit: Payer: BLUE CROSS/BLUE SHIELD | Attending: Family Medicine

## 2017-11-17 DIAGNOSIS — R200 Anesthesia of skin: Secondary | ICD-10-CM | POA: Insufficient documentation

## 2017-11-17 DIAGNOSIS — Z13220 Encounter for screening for lipoid disorders: Secondary | ICD-10-CM | POA: Insufficient documentation

## 2017-11-17 DIAGNOSIS — Z131 Encounter for screening for diabetes mellitus: Secondary | ICD-10-CM | POA: Insufficient documentation

## 2017-11-17 DIAGNOSIS — R202 Paresthesia of skin: Secondary | ICD-10-CM | POA: Insufficient documentation

## 2017-11-17 DIAGNOSIS — Z130 Encounter for screening for diseases of the blood and blood-forming organs and certain disorders involving the immune mechanism: Secondary | ICD-10-CM | POA: Diagnosis not present

## 2017-11-18 LAB — CBC WITH DIFFERENTIAL/PLATELET
BASOS: 1 %
Basophils Absolute: 0 10*3/uL (ref 0.0–0.2)
EOS (ABSOLUTE): 0.1 10*3/uL (ref 0.0–0.4)
EOS: 3 %
Hematocrit: 37.5 % (ref 34.0–46.6)
Hemoglobin: 12.4 g/dL (ref 11.1–15.9)
IMMATURE GRANULOCYTES: 0 %
Immature Grans (Abs): 0 10*3/uL (ref 0.0–0.1)
LYMPHS: 44 %
Lymphocytes Absolute: 1.7 10*3/uL (ref 0.7–3.1)
MCH: 30.8 pg (ref 26.6–33.0)
MCHC: 33.1 g/dL (ref 31.5–35.7)
MCV: 93 fL (ref 79–97)
Monocytes Absolute: 0.3 10*3/uL (ref 0.1–0.9)
Monocytes: 8 %
NEUTROS PCT: 44 %
Neutrophils Absolute: 1.7 10*3/uL (ref 1.4–7.0)
PLATELETS: 297 10*3/uL (ref 150–450)
RBC: 4.02 x10E6/uL (ref 3.77–5.28)
RDW: 12.1 % — ABNORMAL LOW (ref 12.3–15.4)
WBC: 3.8 10*3/uL (ref 3.4–10.8)

## 2017-11-18 LAB — COMPREHENSIVE METABOLIC PANEL
ALT: 13 IU/L (ref 0–32)
AST: 13 IU/L (ref 0–40)
Albumin/Globulin Ratio: 1.8 (ref 1.2–2.2)
Albumin: 4.6 g/dL (ref 3.5–5.5)
Alkaline Phosphatase: 63 IU/L (ref 39–117)
BUN/Creatinine Ratio: 14 (ref 9–23)
BUN: 11 mg/dL (ref 6–24)
Bilirubin Total: 0.3 mg/dL (ref 0.0–1.2)
CALCIUM: 9.6 mg/dL (ref 8.7–10.2)
CO2: 26 mmol/L (ref 20–29)
CREATININE: 0.76 mg/dL (ref 0.57–1.00)
Chloride: 101 mmol/L (ref 96–106)
GFR calc Af Amer: 110 mL/min/{1.73_m2} (ref >59)
GFR, EST NON AFRICAN AMERICAN: 96 mL/min/{1.73_m2} (ref >59)
Globulin, Total: 2.6 g/dL (ref 1.5–4.5)
Glucose: 91 mg/dL (ref 65–99)
POTASSIUM: 4.6 mmol/L (ref 3.5–5.2)
Sodium: 141 mmol/L (ref 134–144)
Total Protein: 7.2 g/dL (ref 6.0–8.5)

## 2017-11-18 LAB — LIPID PANEL
Chol/HDL Ratio: 2.7 ratio (ref 0.0–4.4)
Cholesterol, Total: 139 mg/dL (ref 100–199)
HDL: 52 mg/dL (ref >39)
LDL Calculated: 78 mg/dL (ref 0–99)
TRIGLYCERIDES: 44 mg/dL (ref 0–149)
VLDL Cholesterol Cal: 9 mg/dL (ref 5–40)

## 2017-11-18 LAB — IRON,TIBC AND FERRITIN PANEL
FERRITIN: 165 ng/mL — AB (ref 15–150)
Iron Saturation: 18 % (ref 15–55)
Iron: 57 ug/dL (ref 27–159)
TIBC: 322 ug/dL (ref 250–450)
UIBC: 265 ug/dL (ref 131–425)

## 2017-11-18 LAB — THYROID PANEL WITH TSH
FREE THYROXINE INDEX: 1.7 (ref 1.2–4.9)
T3 UPTAKE RATIO: 26 % (ref 24–39)
T4, Total: 6.7 ug/dL (ref 4.5–12.0)
TSH: 1.18 u[IU]/mL (ref 0.450–4.500)

## 2017-11-18 LAB — VITAMIN B12: VITAMIN B 12: 880 pg/mL (ref 232–1245)

## 2017-11-30 NOTE — Progress Notes (Signed)
Normal lab letter mailed.

## 2017-12-10 ENCOUNTER — Encounter: Payer: Self-pay | Admitting: Family Medicine

## 2017-12-10 ENCOUNTER — Ambulatory Visit (INDEPENDENT_AMBULATORY_CARE_PROVIDER_SITE_OTHER): Payer: BLUE CROSS/BLUE SHIELD | Admitting: Family Medicine

## 2017-12-10 VITALS — BP 123/80 | HR 73 | Resp 17 | Ht 66.0 in | Wt 177.6 lb

## 2017-12-10 DIAGNOSIS — R202 Paresthesia of skin: Secondary | ICD-10-CM

## 2017-12-10 DIAGNOSIS — R200 Anesthesia of skin: Secondary | ICD-10-CM | POA: Diagnosis not present

## 2017-12-10 DIAGNOSIS — M25511 Pain in right shoulder: Secondary | ICD-10-CM | POA: Diagnosis not present

## 2017-12-10 NOTE — Progress Notes (Deleted)
Patient ID: Sheila Weber, female    DOB: August 20, 1975, 42 y.o.   MRN: [id number]  PCP: Scot Jun, FNP  Chief Complaint  Patient presents with  . Back Pain    states that the back pain has gotten much better  . Shoulder Pain    pain has gotten somewhat better but still has pain throughout the day. also having some numbness & tingling.    Subjective:  HPI  Sheila Weber is a 42 y.o. female presents for follow-up of back pain.  Complains of shoulder pain. Continues to experience a "pulling sensation" with ROM right shoulder     Social History   Socioeconomic History  . Marital status: Single    Spouse name: Not on file  . Number of children: Not on file  . Years of education: Not on file  . Highest education level: Not on file  Occupational History  . Not on file  Social Needs  . Financial resource strain: Not on file  . Food insecurity:    Worry: Not on file    Inability: Not on file  . Transportation needs:    Medical: Not on file    Non-medical: Not on file  Tobacco Use  . Smoking status: Never Smoker  . Smokeless tobacco: Never Used  Substance and Sexual Activity  . Alcohol use: No  . Drug use: No  . Sexual activity: Yes    Birth control/protection: None  Lifestyle  . Physical activity:    Days per week: Not on file    Minutes per session: Not on file  . Stress: Not on file  Relationships  . Social connections:    Talks on phone: Not on file    Gets together: Not on file    Attends religious service: Not on file    Active member of club or organization: Not on file    Attends meetings of clubs or organizations: Not on file    Relationship status: Not on file  . Intimate partner violence:    Fear of current or ex partner: Not on file    Emotionally abused: Not on file    Physically abused: Not on file    Forced sexual activity: Not on file  Other Topics Concern  . Not on file  Social History Narrative  . Not on file    Family History   Problem Relation Age of Onset  . Hypertension Mother   . Asthma Mother   . Diabetes Father      Review of Systems  There are no active problems to display for this patient.   No Known Allergies  Prior to Admission medications   Medication Sig Start Date End Date Taking? Authorizing Provider  cyclobenzaprine (FLEXERIL) 10 MG tablet Take 1 tablet (10 mg total) by mouth 3 (three) times daily as needed for muscle spasms (medication may cause drowsiness). 11/12/17   Scot Jun, FNP  ibuprofen (ADVIL,MOTRIN) 200 MG tablet Take 200-400 mg by mouth as needed.    [provider]  meloxicam (MOBIC) 15 MG tablet Take 1 tablet (15 mg total) by mouth daily as needed for pain. 11/12/17   Scot Jun, FNP    Past Medical, Surgical Family and Social History reviewed and updated.    Objective:   Today's Vitals   12/10/17 0928  BP: 123/80  Pulse: 73  Resp: 17  SpO2: 98%  Weight: 177 lb 9.6 oz (80.6 kg)  Height: 5' 6"  (1.676 m)  Wt Readings from Last 3 Encounters:  12/10/17 177 lb 9.6 oz (80.6 kg)  11/12/17 172 lb 3.2 oz (78.1 kg)  05/05/14 170 lb 8 oz (77.3 kg)     Physical Exam General appearance: alert, well developed, well nourished, cooperative and in no distress Head: Normocephalic, without obvious abnormality, atraumatic Respiratory: Respirations even and unlabored, normal respiratory rate Heart: rate and rhythm normal. No gallop or murmurs noted on exam  Extremities: No gross deformities Skin: Skin color, texture, turgor normal. No rashes seen  Psych: Appropriate mood and affect. Neurologic: Mental status: Alert, oriented to person, place, and time, thought content appropriate.  No results found for: POCGLU  No results found for: HGBA1C          Assessment & Plan:  There are no diagnoses linked to this encounter.     -The patient was given clear instructions to go to ER or return to medical center if symptoms do not improve, worsen  or new problems develop. The patient verbalized understanding.    Molli Barrows, FNP Primary Care at [facility] [address] [place] [phone]: [phone]

## 2017-12-10 NOTE — Patient Instructions (Signed)
LEEP POST-PROCEDURE INSTRUCTIONS  1. You may take Ibuprofen, Aleve or Tylenol for pain if needed.  Cramping is normal.  2. You will have black and/or bloody discharge at first.  This will lighten and then turn clear before completely resolving.  This will take 2 to 3 weeks.  3. Put nothing in your vagina until the bleeding or discharge stops (usually 2 or3 days).  4. You need to call if you have redness around the biopsy site, if there is any unusual draining, if the bleeding is heavy, or if you are concerned.  5. Shower or bathe as normal  6. We will call you within one week with results or we will discuss the results at your follow-up appointment if needed.  7. You will need to return for a follow-up Pap smear as directed by your physician.

## 2017-12-14 ENCOUNTER — Encounter (INDEPENDENT_AMBULATORY_CARE_PROVIDER_SITE_OTHER): Payer: Self-pay | Admitting: Orthopaedic Surgery

## 2017-12-14 ENCOUNTER — Ambulatory Visit (INDEPENDENT_AMBULATORY_CARE_PROVIDER_SITE_OTHER): Payer: BLUE CROSS/BLUE SHIELD | Admitting: Orthopaedic Surgery

## 2017-12-14 DIAGNOSIS — G8929 Other chronic pain: Secondary | ICD-10-CM

## 2017-12-14 DIAGNOSIS — M25511 Pain in right shoulder: Secondary | ICD-10-CM

## 2017-12-14 NOTE — Progress Notes (Signed)
Office Visit Note   Patient: Sheila Weber           Date of Birth: 28-Nov-1975           MRN: [id number] Visit Date: 12/14/2017              Requested by: Scot Jun, [address] [place], [place] [address] PCP: Scot Jun, FNP   Assessment & Plan: Visit Diagnoses:  1. Chronic right shoulder pain     Plan: Considerable popping and clicking right shoulder with several diagnostic possibilities including labral tear or biceps tendon instability.  Has had trouble now 4 to 5 months without relief with time, exercises and over-the-counter medicines.  I would suggest an MRI arthrogram.  Do not think therapy or an injection would provide any relief at this point  Follow-Up Instructions: Return after MRI arthrogram right shoulder.   Orders:  Orders Placed This Encounter  Procedures  . DL FLUORO GUIDED NEEDLE PLC ASPIRATION / INJECTTION/LOC  . MR SHOULDER RIGHT W CONTRAST   No orders of the defined types were placed in this encounter.     Procedures: No procedures performed   Clinical Data: No additional findings.   Subjective: Chief Complaint  Patient presents with  . New Patient (Initial Visit)    R SHOULDER PAIN FOR 4 MO NO INJURY PRIOR INJECTIONS OR SURGERY  42 year old female with insidious onset of right shoulder pain over the past 4 to 5 months.  No history of injury or trauma.  Has been experiencing considerable popping and clicking to the point of compromise.  Does work as a Freight forwarder at a [employer].  Having some pain with rotation and overhead activity.  Has tried exercises, time and over-the-counter medicines without much relief on a permanent basis.  No prior history of shoulder dislocation prior surgery.  Had films of her right shoulder performed on 11/12/2017.  I reviewed these on the PACS system.  No acute changes.  Very minimal degenerative change at the acromioclavicular joint.  Humeral head is centered about the glenoid.   No ectopic calcification  HPI  Review of Systems  Constitutional: Positive for fatigue.  HENT: Negative for ear pain.   Eyes: Negative for pain.  Respiratory: Negative for cough.   Cardiovascular: Negative for leg swelling.  Gastrointestinal: Negative for constipation and diarrhea.  Genitourinary: Negative for difficulty urinating.  Musculoskeletal: Positive for back pain and neck pain.  Skin: Negative for rash.  Allergic/Immunologic: Negative for food allergies.  Neurological: Positive for weakness and numbness.  Hematological: Does not bruise/bleed easily.  Psychiatric/Behavioral: Negative for sleep disturbance.     Objective: Vital Signs: BP 123/74 (BP Location: Left Arm, Patient Position: Sitting, Cuff Size: Normal)   Pulse 72   Ht 5' 6"  (1.676 m)   Wt 177 lb (80.3 kg)   BMI 28.57 kg/m   Physical Exam  Constitutional: She is oriented to person, place, and time. She appears well-developed and well-nourished.  HENT:  Mouth/Throat: Oropharynx is clear and moist.  Eyes: Pupils are equal, round, and reactive to light. EOM are normal.  Pulmonary/Chest: Effort normal.  Neurological: She is alert and oriented to person, place, and time.  Skin: Skin is warm and dry.  Psychiatric: She has a normal mood and affect. Her behavior is normal.    Ortho Exam awake alert and oriented x3.  Comfortable sitting.  Obvious popping and clicking and pain with internal/external rotation of the right arm in the impingement position.  Does have some pain over the biceps tendon with a positive speeds sign.  Skin intact.  Biceps muscle intact.  Able to place her arm fully overhead with slightly circuitous motion.  No pain at the acromioclavicular joint.  Good grip and good release.  Specialty Comments:  No specialty comments available.  Imaging: No results found.   PMFS History: There are no active problems to display for this patient.  History reviewed. No pertinent past medical history.    Family History  Problem Relation Age of Onset  . Hypertension Mother   . Asthma Mother   . Diabetes Father     Past Surgical History:  Procedure Laterality Date  . CESAREAN SECTION     Social History   Occupational History  . Not on file  Tobacco Use  . Smoking status: Never Smoker  . Smokeless tobacco: Never Used  Substance and Sexual Activity  . Alcohol use: No  . Drug use: No  . Sexual activity: Yes    Birth control/protection: None

## 2017-12-16 NOTE — Progress Notes (Signed)
Patient ID: Sheila Weber, female    DOB: [birth year], 42 y.o.   MRN: [id number]  PCP: Scot Jun, FNP  Chief Complaint  Patient presents with  . Back Pain    states that the back pain has gotten much better  . Shoulder Pain    pain has gotten somewhat better but still has pain throughout the day. also having some numbness & tingling.    Subjective:  HPI   Sheila Weber is a 42 y.o. female presents for follow-up of back pain and worsening right shoulder pain.    Shoulder pain Denies any recent or previous injury.  Reports a sensation of pulling with any form of range of motion involving her right shoulder. Of recent she started experiencing some numbness and tingling intermittently down the right arm into the fingers. She has attempted relief with ibuprofen without any improvement. 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  Back pain Patient was initially seen in office to establish care and for evaluation of persistent back pain.  She has been prescribed Mobic and cyclobenzaprine during her last office visit and reports today that back pain has resolved. Back pain occasionally recurs with prolonged sitting pain is not as severe as it has been in the past.  Social History   Socioeconomic History  . Marital status: Single    Spouse name: Not on file  . Number of children: Not on file  . Years of education: Not on file  . Highest education level: Not on file  Occupational History  . Not on file  Social Needs  . Financial resource strain: Not on file  . Food insecurity:    Worry: Not on file    Inability: Not on file  . Transportation needs:    Medical: Not on file    Non-medical: Not on file  Tobacco Use  . Smoking status: Never Smoker  . Smokeless tobacco: Never Used  Substance and Sexual Activity  . Alcohol use: No  . Drug use: No  . Sexual activity: Yes    Birth  control/protection: None  Lifestyle  . Physical activity:    Days per week: Not on file    Minutes per session: Not on file  . Stress: Not on file  Relationships  . Social connections:    Talks on phone: Not on file    Gets together: Not on file    Attends religious service: Not on file    Active member of club or organization: Not on file    Attends meetings of clubs or organizations: Not on file    Relationship status: Not on file  . Intimate partner violence:    Fear of current or ex partner: Not on file    Emotionally abused: Not on file    Physically abused: Not on file    Forced sexual activity: Not on file  Other Topics Concern  . Not on file  Social History Narrative  . Not on file    Family History  Problem Relation Age of Onset  . Hypertension Mother   . Asthma Mother   . Diabetes Father      Review of Systems Pertinent negatives listed in HPI There are no active problems to display for this patient.   No Known Allergies  Prior to Admission medications   Medication Sig Start Date End Date Taking? Authorizing Provider  cyclobenzaprine (FLEXERIL)  10 MG tablet Take 1 tablet (10 mg total) by mouth 3 (three) times daily as needed for muscle spasms (medication may cause drowsiness). 11/12/17   Scot Jun, FNP  ibuprofen (ADVIL,MOTRIN) 200 MG tablet Take 200-400 mg by mouth as needed.    [provider]  meloxicam (MOBIC) 15 MG tablet Take 1 tablet (15 mg total) by mouth daily as needed for pain. 11/12/17   Scot Jun, FNP    Past Medical, Surgical Family and Social History reviewed and updated.    Objective:   Today's Vitals   12/10/17 0928  BP: 123/80  Pulse: 73  Resp: 17  SpO2: 98%  Weight: 177 lb 9.6 oz (80.6 kg)  Height: 5' 6"  (1.676 m)    Wt Readings from Last 3 Encounters:  12/14/17 177 lb (80.3 kg)  12/10/17 177 lb 9.6 oz (80.6 kg)  11/12/17 172 lb 3.2 oz (78.1 kg)     Physical Exam General appearance: alert,  well developed, well nourished, cooperative and in no distress Head: Normocephalic, without obvious abnormality, atraumatic Respiratory: Respirations even and unlabored, normal respiratory rate Heart: rate and rhythm normal. No gallop or murmurs noted on exam  Extremities: Right shoulder pain with external rotation, extension of right arm over the head. Palpable tenderness of the ACJ.  Skin: Skin color, texture, turgor normal. No rashes seen  Psych: Appropriate mood and affect. Neurologic: Mental status: Alert, oriented to person, place, and time, thought content appropriate.    Assessment & Plan:  1. Pain in joint of right shoulder 2. Numbness and tingling in right hand Referred to orthopedics for evaluation of shoulder pain. Advised to continue Meloxicam and cyclobenzaprine as needed for pain,  -Patient reports she had an abnormal PAP at GYN office and is scheduled for a procedure. Requested for her to have records forwarded here for review and to update EMR.   -The patient was given clear instructions to go to ER or return to medical center if symptoms do not improve, worsen or new problems develop. The patient verbalized understanding.    Molli Barrows, FNP Primary Care at [facility] [address] [place] [phone]: [phone]

## 2017-12-28 ENCOUNTER — Ambulatory Visit (INDEPENDENT_AMBULATORY_CARE_PROVIDER_SITE_OTHER): Payer: BLUE CROSS/BLUE SHIELD | Admitting: Orthopaedic Surgery

## 2017-12-29 ENCOUNTER — Other Ambulatory Visit: Payer: BLUE CROSS/BLUE SHIELD

## 2017-12-29 ENCOUNTER — Inpatient Hospital Stay: Admission: RE | Admit: 2017-12-29 | Payer: BLUE CROSS/BLUE SHIELD | Source: Ambulatory Visit

## 2018-10-06 ENCOUNTER — Other Ambulatory Visit: Payer: Self-pay | Admitting: Obstetrics and Gynecology

## 2018-10-06 DIAGNOSIS — R928 Other abnormal and inconclusive findings on diagnostic imaging of breast: Secondary | ICD-10-CM

## 2018-10-13 ENCOUNTER — Ambulatory Visit
Admission: RE | Admit: 2018-10-13 | Discharge: 2018-10-13 | Disposition: A | Discharge status: Home / Self Care | Payer: BLUE CROSS/BLUE SHIELD | Source: Ambulatory Visit | Attending: Obstetrics and Gynecology | Admitting: Obstetrics and Gynecology

## 2018-10-13 ENCOUNTER — Other Ambulatory Visit: Payer: Self-pay

## 2018-10-13 ENCOUNTER — Other Ambulatory Visit: Payer: Self-pay | Admitting: Obstetrics and Gynecology

## 2018-10-13 ENCOUNTER — Ambulatory Visit
Admission: RE | Admit: 2018-10-13 | Discharge: 2018-10-13 | Disposition: A | Discharge status: Home / Self Care | Payer: BC Managed Care – PPO | Source: Ambulatory Visit | Attending: Obstetrics and Gynecology | Admitting: Obstetrics and Gynecology

## 2018-10-13 DIAGNOSIS — N6310 Unspecified lump in the right breast, unspecified quadrant: Secondary | ICD-10-CM

## 2018-10-13 DIAGNOSIS — R928 Other abnormal and inconclusive findings on diagnostic imaging of breast: Secondary | ICD-10-CM

## 2019-01-11 ENCOUNTER — Telehealth: Payer: Self-pay

## 2019-01-11 NOTE — Telephone Encounter (Signed)
Called patient to do their pre-visit COVID screening.  Have you tested positive for COVID or are you currently waiting for COVID test results? no  Have you recently traveled internationally([place], [place], [place], [place], [place]) or within the [place] to a hotspot area([place], [place], [place], [place], [place])? no  Are you currently experiencing any of the following symptoms: fever, cough, SHOB, fatigue, body aches, loss of smell/taste, rash, diarrhea, vomiting, severe headaches, weakness, sore throat? no  Have you been in contact with anyone who has recently travelled? no  Have you been in contact with anyone who is experiencing any of the above symptoms or been diagnosed with COVID  or works in or has recently visited a SNF? no

## 2019-01-12 ENCOUNTER — Other Ambulatory Visit: Payer: Self-pay

## 2019-01-12 ENCOUNTER — Ambulatory Visit (INDEPENDENT_AMBULATORY_CARE_PROVIDER_SITE_OTHER): Payer: BC Managed Care – PPO | Admitting: Nurse Practitioner

## 2019-01-12 VITALS — BP 133/83 | HR 78 | Temp 97.3°F | Resp 17 | Ht 66.0 in | Wt 177.6 lb

## 2019-01-12 DIAGNOSIS — F329 Major depressive disorder, single episode, unspecified: Secondary | ICD-10-CM | POA: Diagnosis not present

## 2019-01-12 DIAGNOSIS — G8929 Other chronic pain: Secondary | ICD-10-CM

## 2019-01-12 DIAGNOSIS — R7989 Other specified abnormal findings of blood chemistry: Secondary | ICD-10-CM

## 2019-01-12 DIAGNOSIS — Z23 Encounter for immunization: Secondary | ICD-10-CM

## 2019-01-12 DIAGNOSIS — F419 Anxiety disorder, unspecified: Secondary | ICD-10-CM

## 2019-01-12 DIAGNOSIS — R5383 Other fatigue: Secondary | ICD-10-CM | POA: Diagnosis not present

## 2019-01-12 DIAGNOSIS — M5442 Lumbago with sciatica, left side: Secondary | ICD-10-CM

## 2019-01-12 DIAGNOSIS — Z130 Encounter for screening for diseases of the blood and blood-forming organs and certain disorders involving the immune mechanism: Secondary | ICD-10-CM

## 2019-01-12 DIAGNOSIS — Z131 Encounter for screening for diabetes mellitus: Secondary | ICD-10-CM

## 2019-01-12 DIAGNOSIS — Z0000 Encounter for general adult medical examination without abnormal findings: Secondary | ICD-10-CM

## 2019-01-12 DIAGNOSIS — L853 Xerosis cutis: Secondary | ICD-10-CM | POA: Diagnosis not present

## 2019-01-12 DIAGNOSIS — Z13228 Encounter for screening for other metabolic disorders: Secondary | ICD-10-CM

## 2019-01-12 DIAGNOSIS — Z13220 Encounter for screening for lipoid disorders: Secondary | ICD-10-CM

## 2019-01-12 DIAGNOSIS — M2550 Pain in unspecified joint: Secondary | ICD-10-CM

## 2019-01-12 MED ORDER — ESCITALOPRAM OXALATE 10 MG PO TABS: 10.0000 mg | ORAL_TABLET | Freq: Every day | ORAL | 0 refills | Status: DC

## 2019-01-12 MED ORDER — CYCLOBENZAPRINE HCL 10 MG PO TABS: 10.0000 mg | ORAL_TABLET | Freq: Three times a day (TID) | ORAL | 1 refills | Status: DC | PRN

## 2019-01-12 MED ORDER — IBUPROFEN 800 MG PO TABS: 800.0000 mg | ORAL_TABLET | Freq: Three times a day (TID) | ORAL | 1 refills | Status: AC | PRN

## 2019-01-12 NOTE — Progress Notes (Signed)
Assessment & Plan:  Sheila Weber was seen today for annual exam and flu vaccine.  Diagnoses and all orders for this visit:  Encounter for annual physical exam -     CBC -     CMP14+EGFR -     Lipid Panel  Anxiety and depression -     escitalopram (LEXAPRO) 10 MG tablet; Take 1 tablet (10 mg total) by mouth daily.  Xerosis cutis -     Ambulatory referral to Dermatology  Needs flu shot -     Flu Vaccine QUAD 6+ mos PF IM (Fluarix Quad PF)  Fatigue, unspecified type -     CBC -     Iron, TIBC and Ferritin Panel  Screening for deficiency anemia -     CBC -     Iron, TIBC and Ferritin Panel  Arthralgia of multiple joints -     Uric Acid -     Rheumatoid Arthritis Profile  Chronic bilateral low back pain with left-sided sciatica -     cyclobenzaprine (FLEXERIL) 10 MG tablet; Take 1 tablet (10 mg total) by mouth 3 (three) times daily as needed for muscle spasms. -     ibuprofen (ADVIL) 800 MG tablet; Take 1 tablet (800 mg total) by mouth every 8 (eight) hours as needed. Work on losing weight to help reduce back pain. May alternate with heat and ice application for pain relief. May also alternate with acetaminophen  as prescribed for back pain. Other alternatives include massage, acupuncture and water aerobics.  You must stay active and avoid a sedentary lifestyle.    Screening for metabolic disorder -     YKZ99+JTTS  Lipid screening -     Lipid Panel  Encounter for screening for diabetes mellitus -     A1c  Elevated ferritin level -     Iron, TIBC and Ferritin Panel    Patient has been counseled on age-appropriate routine health concerns for screening and prevention. These are reviewed and up-to-date. Referrals have been placed accordingly. Immunizations are up-to-date or declined.    Subjective:   Chief Complaint  Patient presents with  . Annual Exam    patient is fasting  . Flu Vaccine   HPI Sheila Weber 43 y.o. female presents to office today for CPE. She  has complaints of chronic low back pain with left sided sciatica. Also with numbness and pain in the left foot. Lumbar xray was normal last year. She has not had any physical therapy for her back pain. Symptoms are chronic and ongoing for several years. States she was told she had rheumatoid arthritis when she was younger but has not been worked up for this. She works at [employer] and her job does require prolonged standing and lifting. Relieving factors: flexeril and NSAIDs provide some relief of pain.    Anxiety and Depression Notes increased stressors over the past year. She has never taken an SSRI but is interested in starting one today. Denies any current thoughts of self harm.  Depression screen [facility] 2/9 01/12/2019 11/12/2017  Decreased Interest 1 2  Down, Depressed, Hopeless 3 2  PHQ - 2 Score 4 4  Altered sleeping 2 3  Tired, decreased energy 2 3  Change in appetite 2 3  Feeling bad or failure about yourself  1 2  Trouble concentrating 2 3  Moving slowly or fidgety/restless 1 2  Suicidal thoughts 0 0  PHQ-9 Score 14 20   GAD 7 : Generalized Anxiety Score 01/12/2019 11/12/2017  Nervous, Anxious, on Edge 2 1  Control/stop worrying 2 2  Worry too much - different things 2 2  Trouble relaxing 3 2  Restless 2 1  Easily annoyed or irritable 3 1  Afraid - awful might happen 2 2  Total GAD 7 Score 16 11     Review of Systems  Constitutional: Negative for fever, malaise/fatigue and weight loss.  HENT: Negative.  Negative for nosebleeds.   Eyes: Negative.  Negative for blurred vision, double vision and photophobia.  Respiratory: Negative.  Negative for cough and shortness of breath.   Cardiovascular: Negative.  Negative for chest pain, palpitations and leg swelling.  Gastrointestinal: Negative.  Negative for heartburn, nausea and vomiting.  Genitourinary: Negative.   Musculoskeletal: Positive for back pain, joint pain and myalgias.  Skin: Positive for itching.       Dry skin (hands)  has tried Lubriderm, nivea, cocoa butter, oils.   Neurological: Positive for sensory change. Negative for dizziness, focal weakness, seizures and headaches.  Endo/Heme/Allergies: Negative.   Psychiatric/Behavioral: Positive for depression. Negative for suicidal ideas. The patient is nervous/anxious.     No past medical history on file.  Past Surgical History:  Procedure Laterality Date  . CESAREAN SECTION      Family History  Problem Relation Age of Onset  . Hypertension Mother   . Asthma Mother   . Diabetes Father     Social History Reviewed with no changes to be made today.   Outpatient Medications Prior to Visit  Medication Sig Dispense Refill  . levonorgestrel-ethinyl estradiol (ALESSE) 0.1-20 MG-MCG tablet Take 1 tablet by mouth daily.     No facility-administered medications prior to visit.    No Known Allergies     Objective:    BP 133/83   Pulse 78   Temp (!) 97.3 F (36.3 C) (Temporal)   Resp 17   Wt 177 lb 9.6 oz (80.6 kg)   SpO2 97%   BMI 28.67 kg/m  Wt Readings from Last 3 Encounters:  01/12/19 177 lb 9.6 oz (80.6 kg)  12/14/17 177 lb (80.3 kg)  12/10/17 177 lb 9.6 oz (80.6 kg)    Physical Exam Constitutional:      Appearance: She is well-developed.  HENT:     Head: Normocephalic and atraumatic.     Right Ear: External ear normal.     Left Ear: External ear normal.     Nose: Nose normal.     Mouth/Throat:     Pharynx: No oropharyngeal exudate.  Eyes:     General: No scleral icterus.       Right eye: No discharge.     Conjunctiva/sclera: Conjunctivae normal.     Pupils: Pupils are equal, round, and reactive to light.  Neck:     Thyroid: No thyromegaly.     Trachea: No tracheal deviation.  Cardiovascular:     Rate and Rhythm: Normal rate and regular rhythm.     Pulses:          Dorsalis pedis pulses are 2+ on the right side and 2+ on the left side.     Heart sounds: Normal heart sounds. No murmur. No friction rub.  Pulmonary:      Effort: Pulmonary effort is normal. No accessory muscle usage or respiratory distress.     Breath sounds: Normal breath sounds. No decreased breath sounds, wheezing, rhonchi or rales.  Chest:     Chest wall: No tenderness.  Abdominal:     General: Bowel sounds  are normal. There is no distension.     Palpations: Abdomen is soft. There is no mass.     Tenderness: There is no abdominal tenderness. There is no guarding or rebound.  Musculoskeletal:        General: No tenderness or deformity.     Cervical back: Normal range of motion and neck supple.     Lumbar back: No swelling or bony tenderness. Normal range of motion. Positive left straight leg raise test (minimal pain). Negative right straight leg raise test.       Back:       Feet:  Feet:     Right foot:     Skin integrity: Dry skin present. No skin breakdown.     Left foot:     Skin integrity: Dry skin present. No skin breakdown.     Comments: Pain endorsed with deep palpation of lateral left foot Lymphadenopathy:     Cervical: No cervical adenopathy.  Skin:    General: Skin is warm and dry.     Findings: No erythema.  Neurological:     Mental Status: She is alert and oriented to person, place, and time.     Cranial Nerves: No cranial nerve deficit.     Coordination: Coordination normal.     Deep Tendon Reflexes: Reflexes are normal and symmetric.  Psychiatric:        Speech: Speech normal.        Behavior: Behavior normal.        Thought Content: Thought content normal.        Judgment: Judgment normal.          Patient has been counseled extensively about nutrition and exercise as well as the importance of adherence with medications and regular follow-up. The patient was given clear instructions to go to ER or return to medical center if symptoms don't improve, worsen or new problems develop. The patient verbalized understanding.   Follow-up: Return in about 3 weeks (around 02/02/2019) for Depression.   Gildardo Pounds, FNP-BC [facility] and [facility], [place]   01/12/2019, 12:42 PM

## 2019-01-12 NOTE — Patient Instructions (Signed)
Keeping You Healthy  Get These Tests 1. Blood Pressure- Have your blood pressure checked once a year by your health care provider.  Normal blood pressure is 120/80. 2. Weight- Have your body mass index (BMI) calculated to screen for obesity.  BMI is measure of body fat based on height and weight.  You can also calculate your own BMI at GravelBags.it. 3. Cholesterol- Have your cholesterol checked every 5 years starting at age 45 then yearly starting at age 24. 61. Chlamydia, HIV, and other sexually transmitted diseases- Get screened every year until age 79, then within three months of each new sexual provider. 5. Pap Test - Every 1-5 years; discuss with your health care provider. 6. Mammogram- Every 1-2 years starting at age 76--50  Take these medicines  Calcium with Vitamin D-Your body needs 1200 mg of Calcium each day and [id number] IU of Vitamin D daily.  Your body can only absorb 500 mg of Calcium at a time so Calcium must be taken in 2 or 3 divided doses throughout the day.  Multivitamin with folic acid- Once daily if it is possible for you to become pregnant.  Get these Immunizations  Gardasil-Series of three doses; prevents HPV related illness such as genital warts and cervical cancer.  Menactra-Single dose; prevents meningitis.  Tetanus shot- Every 10 years.  Flu shot-Every year.  Take these steps 1. Do not smoke-Your healthcare provider can help you quit.  For tips on how to quit go to www.smokefree.gov or call 1-800 QUITNOW. 2. Be physically active- Exercise 5 days a week for at least 30 minutes.  If you are not already physically active, start slow and gradually work up to 30 minutes of moderate physical activity.  Examples of moderate activity include walking briskly, dancing, swimming, bicycling, etc. 3. Breast Cancer- A self breast exam every month is important for early detection of breast cancer.  For more information and instruction on self breast exams, ask your  healthcare provider or https://www.patel.info/. 4. Eat a healthy diet- Eat a variety of healthy foods such as fruits, vegetables, whole grains, low fat milk, low fat cheeses, yogurt, lean meats, poultry and fish, beans, nuts, tofu, etc.  For more information go to www. Thenutritionsource.org 5. Drink alcohol in moderation- Limit alcohol intake to one drink or less per day. Never drink and drive. 6. Depression- Your emotional health is as important as your physical health.  If you're feeling down or losing interest in things you normally enjoy please talk to your healthcare provider about being screened for depression. 7. Dental visit- Brush and floss your teeth twice daily; visit your dentist twice a year. 8. Eye doctor- Get an eye exam at least every 2 years. 9. Helmet use- Always wear a helmet when riding a bicycle, motorcycle, rollerblading or skateboarding. 43. Safe sex- If you may be exposed to sexually transmitted infections, use a condom. 11. Seat belts- Seat belts can save your live; always wear one. 12. Smoke/Carbon Monoxide detectors- These detectors need to be installed on the appropriate level of your home. Replace batteries at least once a year. 13. Skin cancer- When out in the sun please cover up and use sunscreen 15 SPF or higher. 14. Violence- If anyone is threatening or hurting you, please tell your healthcare provider.

## 2019-01-13 LAB — CBC
Hematocrit: 35.8 % (ref 34.0–46.6)
Hemoglobin: 12.1 g/dL (ref 11.1–15.9)
MCH: 30.9 pg (ref 26.6–33.0)
MCHC: 33.8 g/dL (ref 31.5–35.7)
MCV: 92 fL (ref 79–97)
Platelets: 317 10*3/uL (ref 150–450)
RBC: 3.91 x10E6/uL (ref 3.77–5.28)
RDW: 12.3 % (ref 11.7–15.4)
WBC: 5 10*3/uL (ref 3.4–10.8)

## 2019-01-13 LAB — LIPID PANEL
Chol/HDL Ratio: 3 ratio (ref 0.0–4.4)
Cholesterol, Total: 137 mg/dL (ref 100–199)
HDL: 46 mg/dL (ref >39)
LDL Chol Calc (NIH): 80 mg/dL (ref 0–99)
Triglycerides: 49 mg/dL (ref 0–149)
VLDL Cholesterol Cal: 11 mg/dL (ref 5–40)

## 2019-01-13 LAB — CMP14+EGFR
ALT: 13 IU/L (ref 0–32)
AST: 15 IU/L (ref 0–40)
Albumin/Globulin Ratio: 1.7 (ref 1.2–2.2)
Albumin: 4.4 g/dL (ref 3.8–4.8)
Alkaline Phosphatase: 55 IU/L (ref 39–117)
BUN/Creatinine Ratio: 14 (ref 9–23)
BUN: 11 mg/dL (ref 6–24)
Bilirubin Total: 0.2 mg/dL (ref 0.0–1.2)
CO2: 23 mmol/L (ref 20–29)
Calcium: 9.3 mg/dL (ref 8.7–10.2)
Chloride: 103 mmol/L (ref 96–106)
Creatinine, Ser: 0.78 mg/dL (ref 0.57–1.00)
GFR calc Af Amer: 106 mL/min/{1.73_m2} (ref >59)
GFR calc non Af Amer: 92 mL/min/{1.73_m2} (ref >59)
Globulin, Total: 2.6 g/dL (ref 1.5–4.5)
Glucose: 99 mg/dL (ref 65–99)
Potassium: 4.6 mmol/L (ref 3.5–5.2)
Sodium: 139 mmol/L (ref 134–144)
Total Protein: 7 g/dL (ref 6.0–8.5)

## 2019-01-13 LAB — IRON,TIBC AND FERRITIN PANEL
Ferritin: 145 ng/mL (ref 15–150)
Iron Saturation: 13 % — ABNORMAL LOW (ref 15–55)
Iron: 46 ug/dL (ref 27–159)
Total Iron Binding Capacity: 357 ug/dL (ref 250–450)
UIBC: 311 ug/dL (ref 131–425)

## 2019-01-13 LAB — URIC ACID: Uric Acid: 4 mg/dL (ref 2.6–6.2)

## 2019-01-13 LAB — RHEUMATOID ARTHRITIS PROFILE
Cyclic Citrullin Peptide Ab: 6 units (ref 0–19)
Rheumatoid fact SerPl-aCnc: <10 IU/mL (ref 0.0–13.9)

## 2019-01-13 LAB — HEMOGLOBIN A1C
Est. average glucose Bld gHb Est-mCnc: 114 mg/dL
Hgb A1c MFr Bld: 5.6 % (ref 4.8–5.6)

## 2019-02-09 ENCOUNTER — Ambulatory Visit (INDEPENDENT_AMBULATORY_CARE_PROVIDER_SITE_OTHER): Payer: BC Managed Care – PPO | Admitting: Nurse Practitioner

## 2019-02-09 DIAGNOSIS — F329 Major depressive disorder, single episode, unspecified: Secondary | ICD-10-CM | POA: Diagnosis not present

## 2019-02-09 DIAGNOSIS — F419 Anxiety disorder, unspecified: Secondary | ICD-10-CM | POA: Diagnosis not present

## 2019-02-09 MED ORDER — PAROXETINE HCL 10 MG PO TABS: 10.0000 mg | ORAL_TABLET | Freq: Every day | ORAL | 1 refills | Status: DC

## 2019-02-09 NOTE — Progress Notes (Signed)
Virtual Visit via Telephone Note Due to national recommendations of social distancing due to [place] 19, telehealth visit is felt to be most appropriate for this patient at this time.  I discussed the limitations, risks, security and privacy concerns of performing an evaluation and management service by telephone and the availability of in person appointments. I also discussed with the patient that there may be a patient responsible charge related to this service. The patient expressed understanding and agreed to proceed.    I connected with Virdell Checo on 02/09/19  at  11:10 AM EST  EDT by telephone and verified that I am speaking with the correct person using two identifiers.   Consent I discussed the limitations, risks, security and privacy concerns of performing an evaluation and management service by telephone and the availability of in person appointments. I also discussed with the patient that there may be a patient responsible charge related to this service. The patient expressed understanding and agreed to proceed.   Location of Patient: Private Residence   Location of Provider: [place] and [place] participating in Telemedicine visit: Geryl Rankins FNP-BC [place]    History of Present Illness: Telemedicine visit for: F/U Depression  She experienced severe headaches and dizziness for 3 days with lexapro after I prescribed it for her in December. She stopped taking it . PHQ9 has decreased however she is still interested in starting another SSRI in place of lexapro. Denies any current thoughts of self harm. She has no questions or concerns today.  Depression screen [facility] 2/9 02/09/2019 01/12/2019 11/12/2017  Decreased Interest 1 1 2   Down, Depressed, Hopeless 1 3 2   PHQ - 2 Score 2 4 4   Altered sleeping 1 2 3   Tired, decreased energy 1 2 3   Change in appetite 1 2 3   Feeling bad or failure about yourself  1 1 2   Trouble  concentrating 0 2 3  Moving slowly or fidgety/restless 0 1 2  Suicidal thoughts 0 0 0  PHQ-9 Score 6 14 20     Past Medical History:  Diagnosis Date  . Anxiety   . Chronic back pain greater than 3 months duration   . Depression     Past Surgical History:  Procedure Laterality Date  . CESAREAN SECTION      Family History  Problem Relation Age of Onset  . Hypertension Mother   . Asthma Mother   . Diabetes Father     Social History   Socioeconomic History  . Marital status: Single    Spouse name: Not on file  . Number of children: Not on file  . Years of education: Not on file  . Highest education level: Not on file  Occupational History  . Not on file  Tobacco Use  . Smoking status: Never Smoker  . Smokeless tobacco: Never Used  Substance and Sexual Activity  . Alcohol use: No  . Drug use: No  . Sexual activity: Yes    Birth control/protection: None  Other Topics Concern  . Not on file  Social History Narrative  . Not on file   Social Determinants of Health   Financial Resource Strain:   . Difficulty of Paying Living Expenses: Not on file  Food Insecurity:   . Worried About Charity fundraiser in the Last Year: Not on file  . Ran Out of Food in the Last Year: Not on file  Transportation Needs:   . Lack  of Transportation (Medical): Not on file  . Lack of Transportation (Non-Medical): Not on file  Physical Activity:   . Days of Exercise per Week: Not on file  . Minutes of Exercise per Session: Not on file  Stress:   . Feeling of Stress : Not on file  Social Connections:   . Frequency of Communication with Friends and Family: Not on file  . Frequency of Social Gatherings with Friends and Family: Not on file  . Attends Religious Services: Not on file  . Active Member of Clubs or Organizations: Not on file  . Attends Archivist Meetings: Not on file  . Marital Status: Not on file     Observations/Objective: Awake, alert and oriented x  3   Review of Systems  Constitutional: Negative for fever, malaise/fatigue and weight loss.  HENT: Negative.  Negative for nosebleeds.   Eyes: Negative.  Negative for blurred vision, double vision and photophobia.  Respiratory: Negative.  Negative for cough and shortness of breath.   Cardiovascular: Negative.  Negative for chest pain, palpitations and leg swelling.  Gastrointestinal: Negative.  Negative for heartburn, nausea and vomiting.  Musculoskeletal: Negative.  Negative for myalgias.  Neurological: Negative.  Negative for dizziness, focal weakness, seizures and headaches.  Psychiatric/Behavioral: Positive for depression. Negative for suicidal ideas.    Assessment and Plan: Anetta was seen today for depression and anxiety.  Diagnoses and all orders for this visit:  Anxiety and depression -     PARoxetine (PAXIL) 10 MG tablet; Take 1 tablet (10 mg total) by mouth daily.     Follow Up Instructions Return in about 4 weeks (around 03/09/2019) for anxiety and depression.     I discussed the assessment and treatment plan with the patient. The patient was provided an opportunity to ask questions and all were answered. The patient agreed with the plan and demonstrated an understanding of the instructions.   The patient was advised to call back or seek an in-person evaluation if the symptoms worsen or if the condition fails to improve as anticipated.  I provided 13 minutes of non-face-to-face time during this encounter including median intraservice time, reviewing previous notes, labs, imaging, medications and explaining diagnosis and management.  Gildardo Pounds, FNP-BC

## 2019-04-15 ENCOUNTER — Other Ambulatory Visit: Payer: Self-pay

## 2019-04-15 ENCOUNTER — Ambulatory Visit
Admission: RE | Admit: 2019-04-15 | Discharge: 2019-04-15 | Disposition: A | Discharge status: Home / Self Care | Payer: BC Managed Care – PPO | Source: Ambulatory Visit | Attending: Obstetrics and Gynecology | Admitting: Obstetrics and Gynecology

## 2019-04-15 ENCOUNTER — Other Ambulatory Visit: Payer: Self-pay | Admitting: Obstetrics and Gynecology

## 2019-04-15 DIAGNOSIS — R928 Other abnormal and inconclusive findings on diagnostic imaging of breast: Secondary | ICD-10-CM

## 2019-04-15 DIAGNOSIS — N6310 Unspecified lump in the right breast, unspecified quadrant: Secondary | ICD-10-CM

## 2019-06-22 ENCOUNTER — Other Ambulatory Visit: Payer: Self-pay | Admitting: Nurse Practitioner

## 2019-06-22 DIAGNOSIS — F419 Anxiety disorder, unspecified: Secondary | ICD-10-CM

## 2019-10-18 ENCOUNTER — Other Ambulatory Visit: Payer: BC Managed Care – PPO

## 2019-12-13 ENCOUNTER — Ambulatory Visit
Admission: EM | Admit: 2019-12-13 | Discharge: 2019-12-13 | Disposition: A | Discharge status: Home / Self Care | Payer: BC Managed Care – PPO | Attending: Internal Medicine | Admitting: Internal Medicine

## 2019-12-13 ENCOUNTER — Ambulatory Visit: Payer: Medicaid Other

## 2019-12-13 DIAGNOSIS — M5416 Radiculopathy, lumbar region: Secondary | ICD-10-CM

## 2019-12-13 DIAGNOSIS — G8929 Other chronic pain: Secondary | ICD-10-CM

## 2019-12-13 MED ORDER — CYCLOBENZAPRINE HCL 10 MG PO TABS: 10.0000 mg | ORAL_TABLET | Freq: Two times a day (BID) | ORAL | 0 refills | Status: DC | PRN

## 2019-12-13 MED ORDER — MELOXICAM 15 MG PO TBDP: ORAL_TABLET | ORAL | 0 refills | Status: DC

## 2019-12-13 NOTE — Discharge Instructions (Signed)
Follow with your family Dr as schedule and work on getting your MRI done

## 2019-12-13 NOTE — ED Provider Notes (Signed)
[facility]    CSN: [id number] Arrival date & time: 12/13/19  1029      History   Chief Complaint Chief Complaint  Patient presents with  . Back Pain    HPI Syncere Dufault is a 44 y.o. female who presents with flair of her chronic  Mid thoracic that radiates to L trapezius and lower lumbar back pain that radiates to  L leg and lateral toes for the past year and her PCP has placed her on 2 different medications like  muscle relaxer she does not recall the name,  Meloxicam and Ibuprofen, but she cant see them til 12/21. She was supposed to have an MRI today, but could not afford it and cancelled it. The Ibuprofen does not help her. She works at [employer] and does have to do lifting. Had been working up to 80h/week, but has slowed down this past week. Denies saddle paresthesia or urinary or bowel incontinence.     Past Medical History:  Diagnosis Date  . Anxiety   . Chronic back pain greater than 3 months duration   . Depression     There are no problems to display for this patient.   Past Surgical History:  Procedure Laterality Date  . CESAREAN SECTION      OB History    Gravida  2   Para  2   Term  2   Preterm      AB      Living  2     SAB      TAB      Ectopic      Multiple      Live Births               Home Medications    Prior to Admission medications   Not on File    Family History Family History  Problem Relation Age of Onset  . Hypertension Mother   . Asthma Mother   . Diabetes Father     Social History Social History   Tobacco Use  . Smoking status: Never Smoker  . Smokeless tobacco: Never Used  Vaping Use  . Vaping Use: Never used  Substance Use Topics  . Alcohol use: No  . Drug use: No     Allergies   Patient has no known allergies.   Review of Systems Review of Systems  Constitutional: Negative for fever.  Respiratory: Negative for cough.   Gastrointestinal: Negative for abdominal pain.        No bowel incontinence  Genitourinary: Negative for difficulty urinating.       Denies incontinence  Musculoskeletal: Positive for back pain. Negative for gait problem, neck pain and neck stiffness.  Skin: Negative for rash.  Neurological: Positive for numbness. Negative for weakness.     Physical Exam Triage Vital Signs ED Triage Vitals [12/13/19 1050]  Enc Vitals Group     BP (!) 143/87     Pulse Rate 83     Resp 18     Temp 99.3 F (37.4 C)     Temp Source Oral     SpO2 98 %     Weight      Height      Head Circumference      Peak Flow      Pain Score 8     Pain Loc      Pain Edu?      Excl. in [place]?    No data found.  Updated Vital Signs BP (!) 143/87 (BP Location: Left Arm)   Pulse 83   Temp 99.3 F (37.4 C) (Oral)   Resp 18   SpO2 98%   Visual Acuity Right Eye Distance:   Left Eye Distance:   Bilateral Distance:    Right Eye Near:   Left Eye Near:    Bilateral Near:     Physical Exam Vitals and nursing note reviewed.  Constitutional:      General: She is not in acute distress.    Appearance: She is obese. She is not toxic-appearing.  HENT:     Right Ear: External ear normal.     Left Ear: External ear normal.  Eyes:     General: No scleral icterus.    Conjunctiva/sclera: Conjunctivae normal.  Pulmonary:     Effort: Pulmonary effort is normal.  Musculoskeletal:        General: Normal range of motion.     Cervical back: Neck supple.     Comments: BACK- has local tenderness on Mid thoracic central and muscular tenderness and upper lumbar and lower lumbar tenderness with palpation. The L upper trapezius is also tense and tender.   Skin:    General: Skin is warm and dry.     Findings: No rash.  Neurological:     Mental Status: She is alert and oriented to person, place, and time.     Motor: No weakness.     Gait: Gait normal.     Deep Tendon Reflexes: Reflexes normal.  Psychiatric:        Mood and Affect: Mood normal.        Behavior: Behavior  normal.        Thought Content: Thought content normal.        Judgment: Judgment normal.    UC Treatments / Results  Labs (all labs ordered are listed, but only abnormal results are displayed) Labs Reviewed - No data to display  EKG   Radiology No results found.  Procedures Procedures (including critical care time)  Medications Ordered in UC Medications - No data to display  Initial Impression / Assessment and Plan / UC Course  I have reviewed the triage vital signs and the nursing notes. I refilled the Meloxicam and told her she cant take Ibuprofen while on this. I also prescribed her Flexeril since this sounded familiar as one of the muscle relaxers her PCP gave her in the past.  Encouraged to work on getting the MRI done.  Final Clinical Impressions(s) / UC Diagnoses   Final diagnoses:  None   Discharge Instructions   None    ED Prescriptions    None     PDMP not reviewed this encounter.   Shelby Mattocks, PA-C 12/13/19 1136

## 2019-12-13 NOTE — ED Triage Notes (Signed)
Pt c/o mid back pain, lt shoulder pain, and numbness to lt toes for over a year. States has been tx'd by PCP with meloxicam and ibuprofen 839m. States is out of her meds and next appt with PCP is 12/21. Denies any new injuries.

## 2020-01-16 ENCOUNTER — Ambulatory Visit: Payer: Medicaid Other | Admitting: Internal Medicine

## 2020-01-17 ENCOUNTER — Other Ambulatory Visit (INDEPENDENT_AMBULATORY_CARE_PROVIDER_SITE_OTHER): Payer: BC Managed Care – PPO

## 2020-01-17 DIAGNOSIS — Z13228 Encounter for screening for other metabolic disorders: Secondary | ICD-10-CM

## 2020-01-17 DIAGNOSIS — Z0000 Encounter for general adult medical examination without abnormal findings: Secondary | ICD-10-CM | POA: Diagnosis not present

## 2020-01-17 DIAGNOSIS — Z130 Encounter for screening for diseases of the blood and blood-forming organs and certain disorders involving the immune mechanism: Secondary | ICD-10-CM

## 2020-01-17 DIAGNOSIS — Z13220 Encounter for screening for lipoid disorders: Secondary | ICD-10-CM | POA: Diagnosis not present

## 2020-01-18 LAB — CBC WITH DIFFERENTIAL/PLATELET
Basophils Absolute: 0 10*3/uL (ref 0.0–0.2)
Basos: 1 %
EOS (ABSOLUTE): 0.1 10*3/uL (ref 0.0–0.4)
Eos: 2 %
Hematocrit: 36.7 % (ref 34.0–46.6)
Hemoglobin: 11.9 g/dL (ref 11.1–15.9)
Immature Grans (Abs): 0 10*3/uL (ref 0.0–0.1)
Immature Granulocytes: 0 %
Lymphocytes Absolute: 2 10*3/uL (ref 0.7–3.1)
Lymphs: 46 %
MCH: 30.7 pg (ref 26.6–33.0)
MCHC: 32.4 g/dL (ref 31.5–35.7)
MCV: 95 fL (ref 79–97)
Monocytes Absolute: 0.3 10*3/uL (ref 0.1–0.9)
Monocytes: 6 %
Neutrophils Absolute: 1.9 10*3/uL (ref 1.4–7.0)
Neutrophils: 45 %
Platelets: 333 10*3/uL (ref 150–450)
RBC: 3.88 x10E6/uL (ref 3.77–5.28)
RDW: 12.9 % (ref 11.7–15.4)
WBC: 4.3 10*3/uL (ref 3.4–10.8)

## 2020-01-18 LAB — COMPREHENSIVE METABOLIC PANEL
ALT: 14 IU/L (ref 0–32)
AST: 14 IU/L (ref 0–40)
Albumin/Globulin Ratio: 1.5 (ref 1.2–2.2)
Albumin: 4.5 g/dL (ref 3.8–4.8)
Alkaline Phosphatase: 55 IU/L (ref 44–121)
BUN/Creatinine Ratio: 18 (ref 9–23)
BUN: 16 mg/dL (ref 6–24)
Bilirubin Total: 0.3 mg/dL (ref 0.0–1.2)
CO2: 25 mmol/L (ref 20–29)
Calcium: 9.3 mg/dL (ref 8.7–10.2)
Chloride: 103 mmol/L (ref 96–106)
Creatinine, Ser: 0.88 mg/dL (ref 0.57–1.00)
GFR calc Af Amer: 91 mL/min/{1.73_m2} (ref >59)
GFR calc non Af Amer: 79 mL/min/{1.73_m2} (ref >59)
Globulin, Total: 3 g/dL (ref 1.5–4.5)
Glucose: 82 mg/dL (ref 65–99)
Potassium: 4.4 mmol/L (ref 3.5–5.2)
Sodium: 140 mmol/L (ref 134–144)
Total Protein: 7.5 g/dL (ref 6.0–8.5)

## 2020-01-18 LAB — LIPID PANEL
Chol/HDL Ratio: 3 ratio (ref 0.0–4.4)
Cholesterol, Total: 136 mg/dL (ref 100–199)
HDL: 46 mg/dL (ref >39)
LDL Chol Calc (NIH): 80 mg/dL (ref 0–99)
Triglycerides: 45 mg/dL (ref 0–149)
VLDL Cholesterol Cal: 10 mg/dL (ref 5–40)

## 2020-01-18 LAB — IRON,TIBC AND FERRITIN PANEL
Ferritin: 142 ng/mL (ref 15–150)
Iron Saturation: 19 % (ref 15–55)
Iron: 64 ug/dL (ref 27–159)
Total Iron Binding Capacity: 345 ug/dL (ref 250–450)
UIBC: 281 ug/dL (ref 131–425)

## 2020-01-24 ENCOUNTER — Telehealth: Payer: Self-pay | Admitting: Family Medicine

## 2020-01-24 ENCOUNTER — Ambulatory Visit (INDEPENDENT_AMBULATORY_CARE_PROVIDER_SITE_OTHER): Payer: BC Managed Care – PPO | Admitting: Family

## 2020-01-24 ENCOUNTER — Other Ambulatory Visit: Payer: Self-pay

## 2020-01-24 ENCOUNTER — Encounter: Payer: Self-pay | Admitting: Family

## 2020-01-24 VITALS — BP 122/79 | HR 75 | Ht 66.73 in | Wt 170.6 lb

## 2020-01-24 DIAGNOSIS — R350 Frequency of micturition: Secondary | ICD-10-CM

## 2020-01-24 DIAGNOSIS — Z0000 Encounter for general adult medical examination without abnormal findings: Secondary | ICD-10-CM | POA: Diagnosis not present

## 2020-01-24 DIAGNOSIS — G5601 Carpal tunnel syndrome, right upper limb: Secondary | ICD-10-CM

## 2020-01-24 DIAGNOSIS — Z2821 Immunization not carried out because of patient refusal: Secondary | ICD-10-CM

## 2020-01-24 DIAGNOSIS — Z1211 Encounter for screening for malignant neoplasm of colon: Secondary | ICD-10-CM

## 2020-01-24 DIAGNOSIS — Z1212 Encounter for screening for malignant neoplasm of rectum: Secondary | ICD-10-CM

## 2020-01-24 DIAGNOSIS — Z309 Encounter for contraceptive management, unspecified: Secondary | ICD-10-CM

## 2020-01-24 DIAGNOSIS — Z712 Person consulting for explanation of examination or test findings: Secondary | ICD-10-CM

## 2020-01-24 DIAGNOSIS — Z0100 Encounter for examination of eyes and vision without abnormal findings: Secondary | ICD-10-CM

## 2020-01-24 LAB — POCT URINALYSIS DIP (CLINITEK)
Bilirubin, UA: NEGATIVE
Glucose, UA: NEGATIVE mg/dL
Ketones, POC UA: NEGATIVE mg/dL
Nitrite, UA: NEGATIVE
POC PROTEIN,UA: NEGATIVE
Spec Grav, UA: 1.02 (ref 1.010–1.025)
Urobilinogen, UA: 0.2 E.U./dL
pH, UA: 5.5 (ref 5.0–8.0)

## 2020-01-24 MED ORDER — NAPROXEN 500 MG PO TABS: 500.0000 mg | ORAL_TABLET | Freq: Two times a day (BID) | ORAL | 0 refills | Status: DC

## 2020-01-24 MED ORDER — GABAPENTIN 300 MG PO CAPS: 300.0000 mg | ORAL_CAPSULE | Freq: Every day | ORAL | 0 refills | Status: DC

## 2020-01-24 NOTE — Progress Notes (Addendum)
Patient ID: Sheila Weber, female    DOB: 03-01-1975  MRN: [id number]  CC: Physical Examination   Subjective: Sheila Weber is a 44 y.o. female who presents for physical examination.    RIGHT HAND NUMBNESS: Duration: months Involved hand/wrist: Right Pain: sometimes Radiation:  right arm Onset: gradual Paresthesias: yes Weakness: sometimes Mechanism of injury: does a lot of heavy lifting at work Location: diffuse  Aggravating factors movement Alleviating factors: rest and heat Status: worse  BIRTH CONTROL: Requesting refill of Aviane. Taking medication without complications and side effects.    Current Outpatient Medications on File Prior to Visit  Medication Sig Dispense Refill  . cyclobenzaprine (FLEXERIL) 10 MG tablet Take 1 tablet (10 mg total) by mouth 2 (two) times daily as needed for muscle spasms. 30 tablet 0  . Meloxicam 15 MG TBDP One qd for pain and inflammation 30 tablet 0   No current facility-administered medications on file prior to visit.    No Known Allergies  Social History   Socioeconomic History  . Marital status: Single    Spouse name: Not on file  . Number of children: Not on file  . Years of education: Not on file  . Highest education level: Not on file  Occupational History  . Not on file  Tobacco Use  . Smoking status: Never Smoker  . Smokeless tobacco: Never Used  Vaping Use  . Vaping Use: Never used  Substance and Sexual Activity  . Alcohol use: No  . Drug use: No  . Sexual activity: Yes    Birth control/protection: None  Other Topics Concern  . Not on file  Social History Narrative  . Not on file   Social Determinants of Health   Financial Resource Strain: Not on file  Food Insecurity: Not on file  Transportation Needs: Not on file  Physical Activity: Not on file  Stress: Not on file  Social Connections: Not on file  Intimate Partner Violence: Not on file    Family History  Problem Relation Age of Onset  .  Hypertension Mother   . Asthma Mother   . Diabetes Father     Past Surgical History:  Procedure Laterality Date  . CESAREAN SECTION      ROS: Review of Systems Negative except as stated above  PHYSICAL EXAM: BP 122/79 (BP Location: Left Arm, Patient Position: Sitting)   Pulse 75   Ht 5' 6.73" (1.695 m)   Wt 170 lb 9.6 oz (77.4 kg)   SpO2 98%   BMI 26.93 kg/m   Physical Exam Exam conducted with a chaperone present.  Constitutional:      Appearance: Normal appearance.  HENT:     Head: Normocephalic and atraumatic.     Right Ear: Tympanic membrane, ear canal and external ear normal.     Left Ear: Tympanic membrane, ear canal and external ear normal.     Nose: Nose normal.     Mouth/Throat:     Mouth: Mucous membranes are moist.     Pharynx: Oropharynx is clear.  Eyes:     Extraocular Movements: Extraocular movements intact.     Conjunctiva/sclera: Conjunctivae normal.     Pupils: Pupils are equal, round, and reactive to light.  Cardiovascular:     Rate and Rhythm: Normal rate and regular rhythm.     Pulses: Normal pulses.     Heart sounds: Normal heart sounds.  Pulmonary:     Effort: Pulmonary effort is normal.  Chest:  Breasts:  Right: Normal.     Left: Normal.      Comments: Chaperone Elmon Else, CMA. Genitourinary:    Comments: Patient declined exam. Musculoskeletal:     Cervical back: Normal range of motion and neck supple.  Skin:    General: Skin is warm and dry.     Capillary Refill: Capillary refill takes less than 2 seconds.  Neurological:     General: No focal deficit present.     Mental Status: She is alert and oriented to person, place, and time.  Psychiatric:        Mood and Affect: Mood normal.        Behavior: Behavior normal.    Results for orders placed or performed in visit on 01/24/20  POCT URINALYSIS DIP (CLINITEK)  Result Value Ref Range   Color, UA yellow yellow   Clarity, UA cloudy (A) clear   Glucose, UA negative  negative mg/dL   Bilirubin, UA negative negative   Ketones, POC UA negative negative mg/dL   Spec Grav, UA 1.020 1.010 - 1.025   Blood, UA trace-intact (A) negative   pH, UA 5.5 5.0 - 8.0   POC PROTEIN,UA negative negative, trace   Urobilinogen, UA 0.2 0.2 or 1.0 E.U./dL   Nitrite, UA Negative Negative   Leukocytes, UA Trace (A) Negative    ASSESSMENT AND PLAN: 1. Encounter for annual physical exam: - Patient presents today for annual physical examination.  2. Encounter to discuss test results: - Physical exam labs complete prior to today's visit. Discussed the following results with patient kidney function normal, liver function normal, cholesterol normal, and no anemia.  3. Carpal tunnel syndrome of right wrist: - Patient with right hand numbness which may be related to carpal tunnel. Patient reports using repetitive hand movements at her place of employment which includes heavy lifting sometimes. - Naproxen and Gabapentin as prescribed.  - Per patient request referral to Hand Surgery for further evaluation and management. - Follow-up with primary physician as scheduled.  - gabapentin (NEURONTIN) 300 MG capsule; Take 1 capsule (300 mg total) by mouth at bedtime.  Dispense: 30 capsule; Refill: 0 - naproxen (NAPROSYN) 500 MG tablet; Take 1 tablet (500 mg total) by mouth 2 (two) times daily with a meal.  Dispense: 30 tablet; Refill: 0 - Ambulatory referral to Hand Surgery  4. Urinary frequency: - Per patient request uinalysis to screen for possible urinary tract infection. - Urinalysis negative for urinary tract infection today in clinic. - Follow-up with primary physician as needed. - POCT URINALYSIS DIP (CLINITEK)  5. Special screening for malignant neoplasm of colon: 6. Screening for rectal cancer: - Per patient preference colon cancer screening via Cologuard.  - Fecal occult blood, imunochemical  7. Influenza vaccine refused: - Declined flu vaccine during today's  visit.  8. Encounter for contraceptive management, unspecified type: - Continue Aviane as prescribed.  - Follow-up with primary provider as needed.  - levonorgestrel-ethinyl estradiol (AVIANE) 0.1-20 MG-MCG tablet; Take 1 tablet by mouth daily.  Dispense: 28 tablet; Refill: 11  9. Eye exam, routine: - Referral for routine eye exam. - Ambulatory referral to Ophthalmology   Patient was given the opportunity to ask questions.  Patient verbalized understanding of the plan and was able to repeat key elements of the plan. Patient was given clear instructions to go to Emergency Department or return to medical center if symptoms don't improve, worsen, or new problems develop.The patient verbalized understanding.   Orders Placed This Encounter  Procedures  .  Fecal occult blood, imunochemical  . Ambulatory referral to Hand Surgery  . POCT URINALYSIS DIP (CLINITEK)     Requested Prescriptions   Signed Prescriptions Disp Refills  . naproxen (NAPROSYN) 500 MG tablet 30 tablet 0    Sig: Take 1 tablet (500 mg total) by mouth 2 (two) times daily with a meal.  . gabapentin (NEURONTIN) 300 MG capsule 30 capsule 0    Sig: Take 1 capsule (300 mg total) by mouth at bedtime.    Return for as needed with primary physician.Camillia Herter, NP

## 2020-01-24 NOTE — Telephone Encounter (Signed)
Patient called in to let nurse Eboney know that the medication they had discussed at today's visit is called Aviane.

## 2020-01-24 NOTE — Patient Instructions (Addendum)
Physical exam today.   Discussed pre-physical lab results.  Cologuard for colon cancer screening.   Naproxen and Gabapentin for carpal tunnel syndrome.  Keep all appointments with primary physician as scheduled. Carpal Tunnel Syndrome  Carpal tunnel syndrome is a condition that causes pain in your hand and arm. The carpal tunnel is a narrow area that is on the palm side of your wrist. Repeated wrist motion or certain diseases may cause swelling in the tunnel. This swelling can pinch the main nerve in the wrist (median nerve). What are the causes? This condition may be caused by:  Repeated wrist motions.  Wrist injuries.  Arthritis.  A sac of fluid (cyst) or abnormal growth (tumor) in the carpal tunnel.  Fluid buildup during pregnancy. Sometimes the cause is not known. What increases the risk? The following factors may make you more likely to develop this condition:  Having a job in which you move your wrist in the same way many times. This includes jobs like being a Software engineer or a Scientist, water quality.  Being a woman.  Having other health conditions, such as: ? Diabetes. ? Obesity. ? A thyroid gland that is not active enough (hypothyroidism). ? Kidney failure. What are the signs or symptoms? Symptoms of this condition include:  A tingling feeling in your fingers.  Tingling or a loss of feeling (numbness) in your hand.  Pain in your entire arm. This pain may get worse when you bend your wrist and elbow for a long time.  Pain in your wrist that goes up your arm to your shoulder.  Pain that goes down into your palm or fingers.  A weak feeling in your hands. You may find it hard to grab and hold items. You may feel worse at night. How is this diagnosed? This condition is diagnosed with a medical history and physical exam. You may also have tests, such as:  Electromyogram (EMG). This test checks the signals that the nerves send to the muscles.  Nerve conduction study. This test  checks how well signals pass through your nerves.  Imaging tests, such as X-rays, ultrasound, and MRI. These tests check for what might be the cause of your condition. How is this treated? This condition may be treated with:  Lifestyle changes. You will be asked to stop or change the activity that caused your problem.  Doing exercise and activities that make bones and muscles stronger (physical therapy).  Learning how to use your hand again (occupational therapy).  Medicines for pain and swelling (inflammation). You may have injections in your wrist.  A wrist splint.  Surgery. Follow these instructions at home: If you have a splint:  Wear the splint as told by your doctor. Remove it only as told by your doctor.  Loosen the splint if your fingers: ? Tingle. ? Lose feeling (become numb). ? Turn cold and blue.  Keep the splint clean.  If the splint is not waterproof: ? Do not let it get wet. ? Cover it with a watertight covering when you take a bath or a shower. Managing pain, stiffness, and swelling   If told, put ice on the painful area: ? If you have a removable splint, remove it as told by your doctor. ? Put ice in a plastic bag. ? Place a towel between your skin and the bag. ? Leave the ice on for 20 minutes, 2-3 times per day. General instructions  Take over-the-counter and prescription medicines only as told by your doctor.  Rest  your wrist from any activity that may cause pain. If needed, talk with your boss at work about changes that can help your wrist heal.  Do any exercises as told by your doctor, physical therapist, or occupational therapist.  Keep all follow-up visits as told by your doctor. This is important. Contact a doctor if:  You have new symptoms.  Medicine does not help your pain.  Your symptoms get worse. Get help right away if:  You have very bad numbness or tingling in your wrist or hand. Summary  Carpal tunnel syndrome is a condition  that causes pain in your hand and arm.  It is often caused by repeated wrist motions.  Lifestyle changes and medicines are used to treat this problem. Surgery may help in very bad cases.  Follow your doctor's instructions about wearing a splint, resting your wrist, keeping follow-up visits, and calling for help. This information is not intended to replace advice given to you by your health care provider. Make sure you discuss any questions you have with your health care provider. Document Revised: 05/22/2017 Document Reviewed: 05/22/2017 Elsevier Patient Education  [place].

## 2020-01-24 NOTE — Progress Notes (Signed)
Physical Experiencing back pain  Feelings of tingling and numbness in hands and feet for about year on right side  Wants flu shot

## 2020-01-25 MED ORDER — LEVONORGESTREL-ETHINYL ESTRAD 0.1-20 MG-MCG PO TABS: 1.0000 | ORAL_TABLET | Freq: Every day | ORAL | 11 refills | Status: DC

## 2020-01-25 NOTE — Addendum Note (Signed)
Addended by: Camillia Herter on: 01/25/2020 02:34 PM   Modules accepted: Orders

## 2020-02-23 ENCOUNTER — Ambulatory Visit: Payer: BC Managed Care – PPO | Admitting: Orthopaedic Surgery

## 2020-04-13 IMAGING — CR DG SHOULDER 2+V*R*
3 series · 3 of 3 positions shown · non-contrast
Comparison: None.

CLINICAL DATA: Chronic lower back pain for the past year. No known
injury. Right shoulder pain for the past year, radiating down the
right arm.

EXAM:
RIGHT SHOULDER - 2+ VIEW

[shoulder grashey]
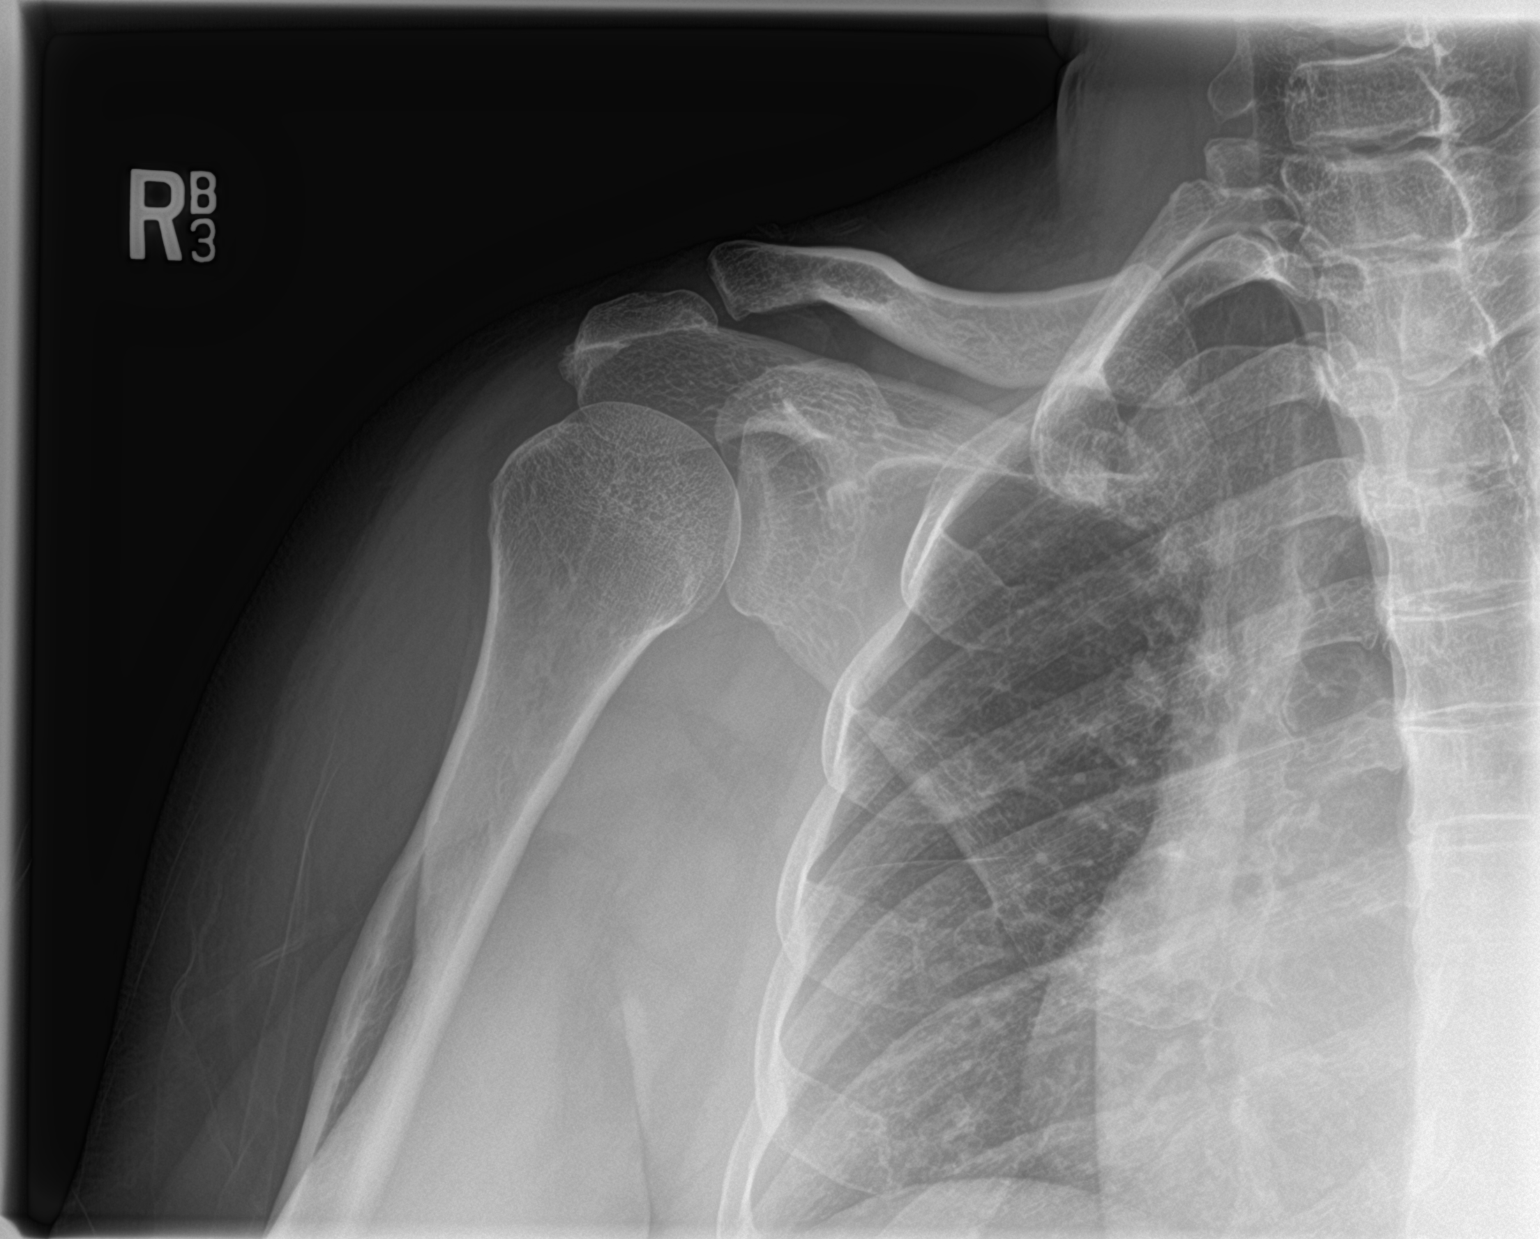

[shoulder y view]
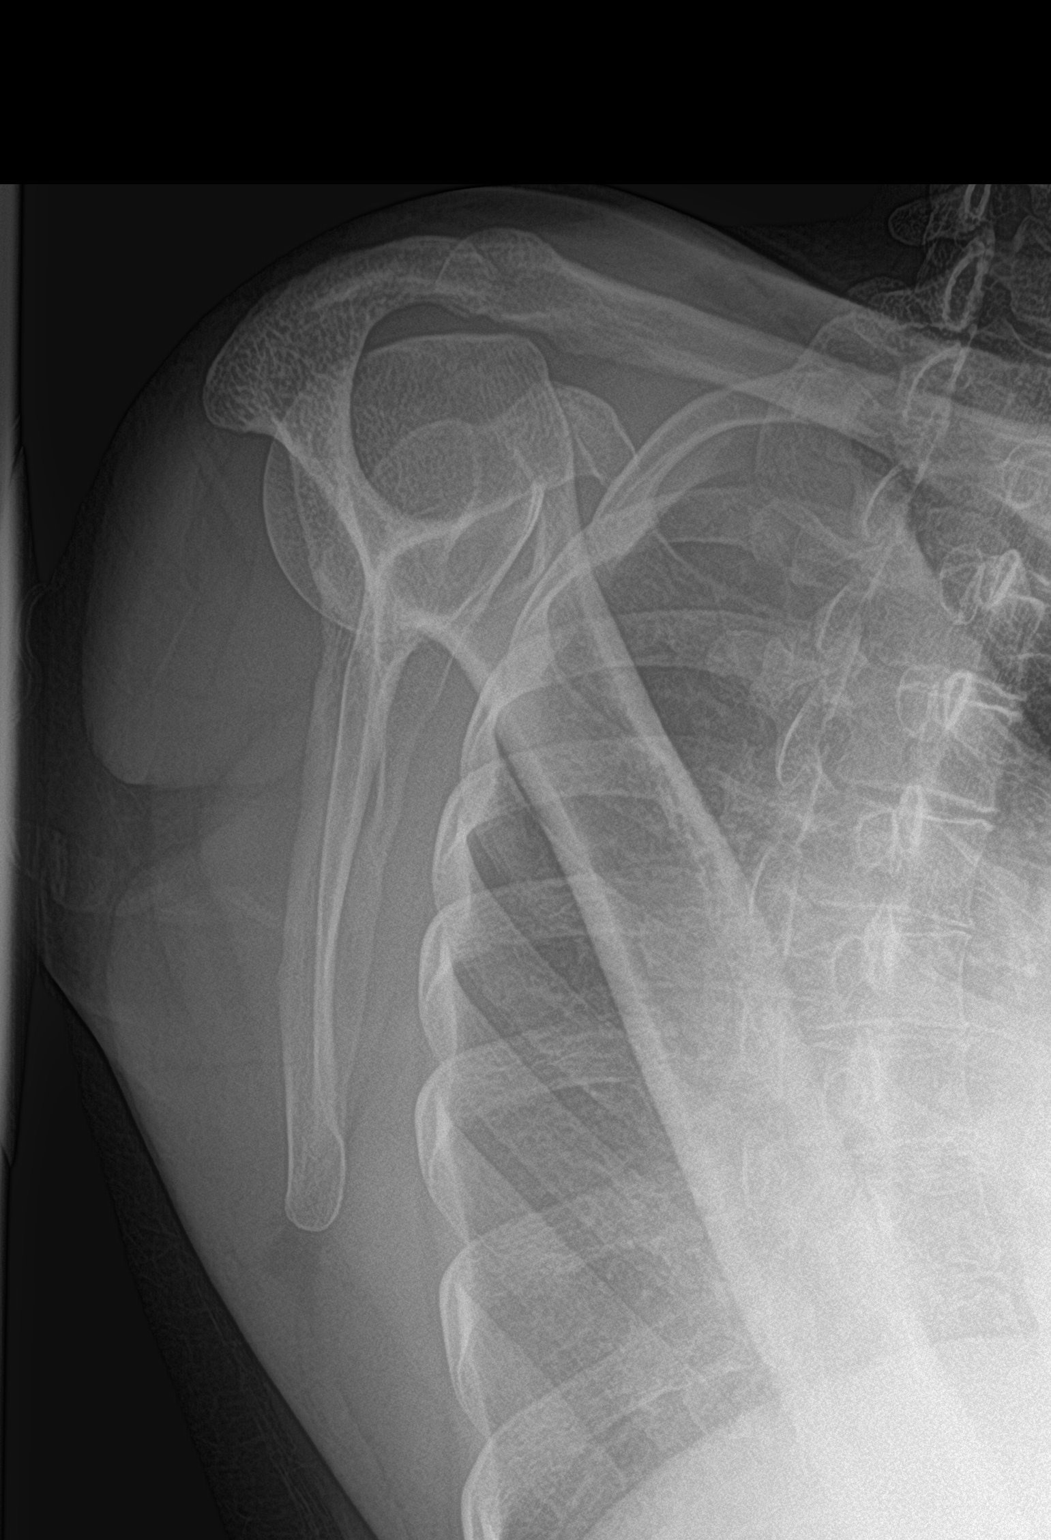

[shoulder axillary]
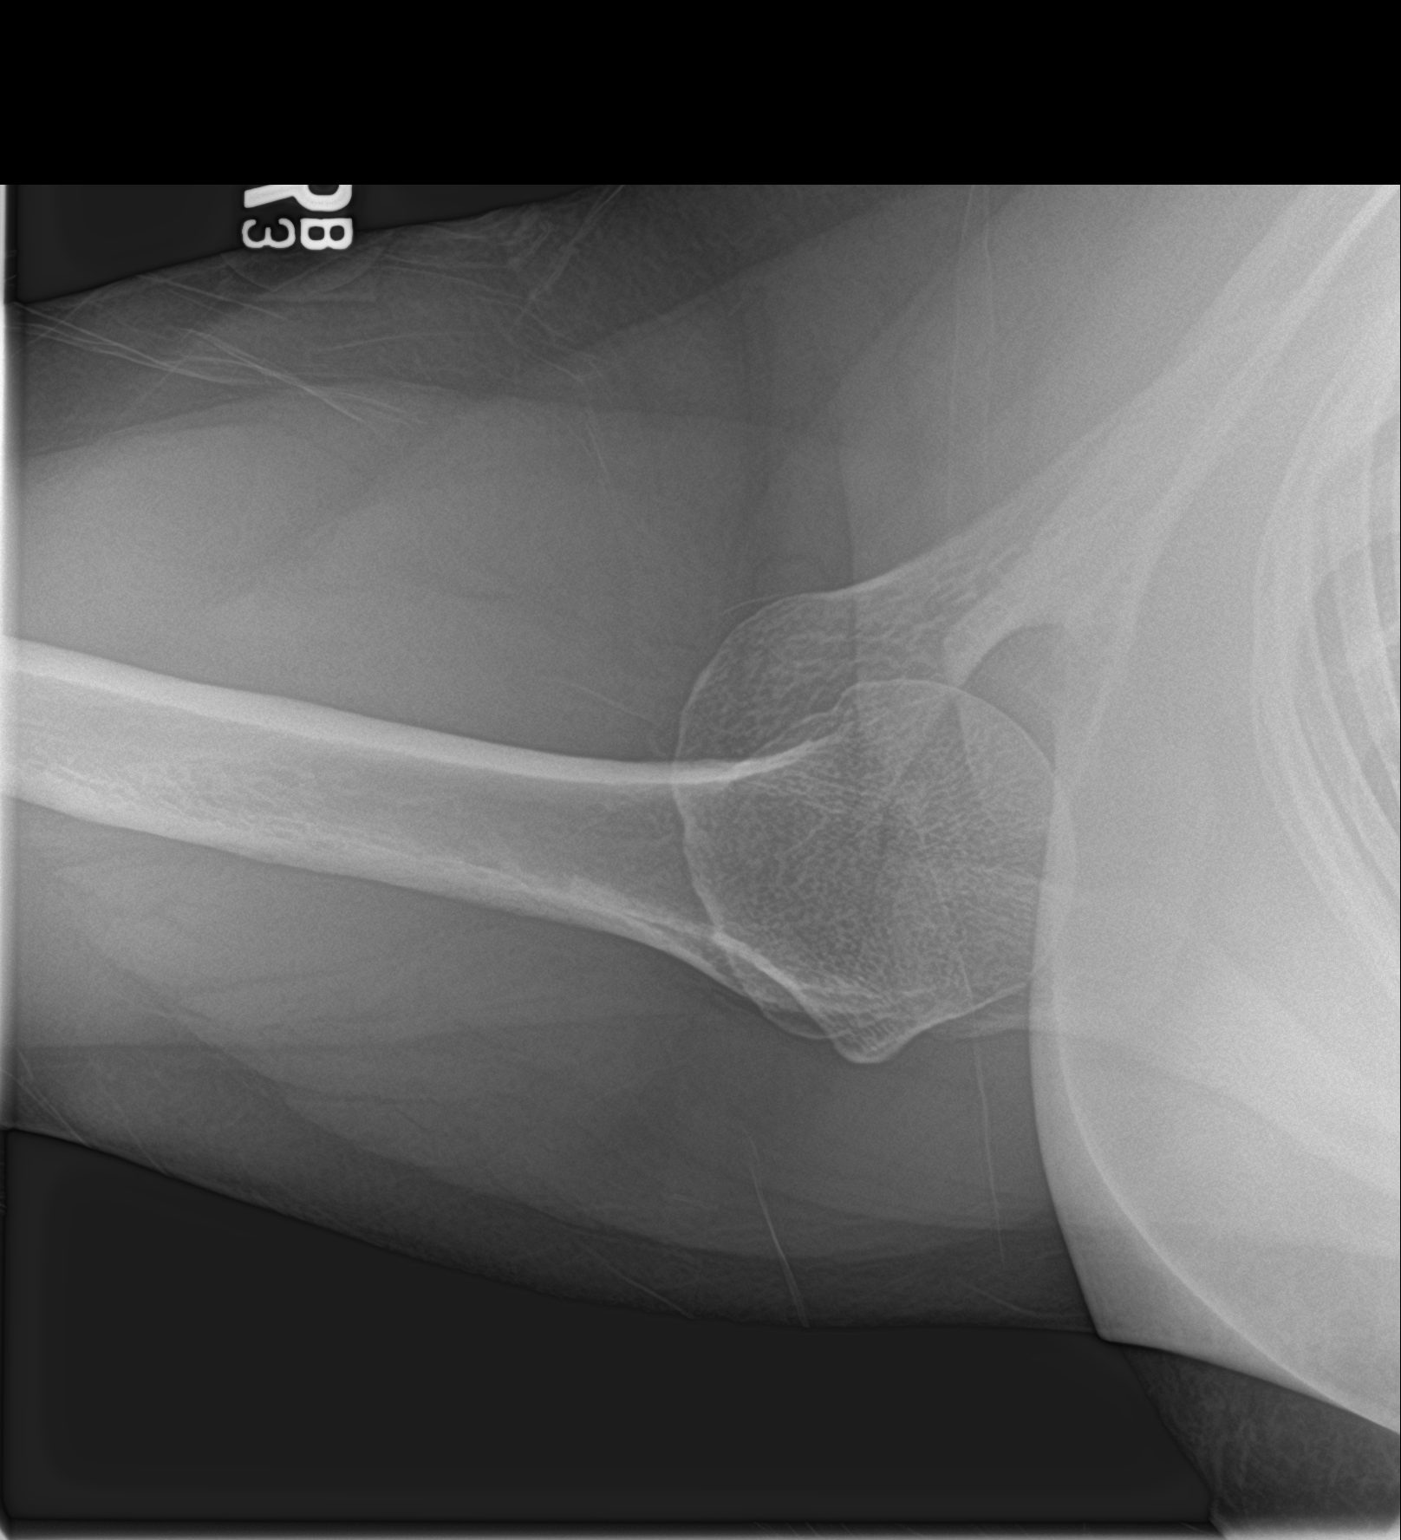

[3 of 3 positions shown; findings below may reference images not displayed]

FINDINGS: There is no evidence of fracture or dislocation. There is no
evidence of arthropathy or other focal bone abnormality. Soft
tissues are unremarkable.
IMPRESSION: Negative.

## 2021-02-21 NOTE — Progress Notes (Signed)
Patient ID: Sheila Weber, female    DOB: 12-13-1975  MRN: [id number]  CC: Annual Physical Exam  Subjective: Leathie Huyett is a 46 y.o. female who presents for annual physical exam.   Her concerns today include: BILATERAL LEG PAIN: Persisting for months.  Leg pain bilaterally below knees with some swelling/tight toes, and numbness. Denies bilateral warmth/redness, recent trauma/injury, shortness of breath, chest pain, and additional red flag symptoms. Endorses does eat a lot of salt. Works long hours but doesn't have to stand too long on her feet. Has taken several medications such as muscle relaxants, Meloxicam, and over-the-counter medications without relief. Was referred to Orthopedics in the past and unable to make the appointment. Would like referral back.   2. INSOMNIA: Occurring for weeks. Reports sleeping for about 2 hours each night, awakening around 1 am or 2 am. When awake at night watching television. Father living in [place] passed away 1 year and 6 days ago. Prior to his decease she would call her father around 1 am or 2 am to check on him because he was sick. Endorses some anxiety because of the recent anniversary of his decease. Not ready for anxiety medications. Denies thoughts of self-harm, suicidal ideations, and homicidal ideations.   3. PERIOD CONCERNS: Stopped taking birth control pill January 2022. No period since February 2022. No spotting in between. Denies hot flashes and additional symptoms. She is sexually active and does not use any protection. Reports has always had a very light and short period of about 2 to 3 days. Noticed prior to period ending periods would skip and be every other month. No history of early menopause in family. Not ready for referral to Gynecology as of present.  Depression screen [facility] 2/9 02/25/2021 01/24/2020 02/09/2019 01/12/2019 11/12/2017  Decreased Interest 0 0 1 1 2   Down, Depressed, Hopeless 0 0 1 3 2   PHQ - 2 Score 0 0 2 4 4   Altered  sleeping - - 1 2 3   Tired, decreased energy - - 1 2 3   Change in appetite - - 1 2 3   Feeling bad or failure about yourself  - - 1 1 2   Trouble concentrating - - 0 2 3  Moving slowly or fidgety/restless - - 0 1 2  Suicidal thoughts - - 0 0 0  PHQ-9 Score - - 6 14 20      Current Outpatient Medications on File Prior to Visit  Medication Sig Dispense Refill   cyclobenzaprine (FLEXERIL) 10 MG tablet Take 1 tablet (10 mg total) by mouth 2 (two) times daily as needed for muscle spasms. 30 tablet 0   gabapentin (NEURONTIN) 300 MG capsule Take 1 capsule (300 mg total) by mouth at bedtime. 30 capsule 0   levonorgestrel-ethinyl estradiol (AVIANE) 0.1-20 MG-MCG tablet Take 1 tablet by mouth daily. 28 tablet 11   Meloxicam 15 MG TBDP One qd for pain and inflammation 30 tablet 0   naproxen (NAPROSYN) 500 MG tablet Take 1 tablet (500 mg total) by mouth 2 (two) times daily with a meal. 30 tablet 0   No current facility-administered medications on file prior to visit.    No Known Allergies  Social History   Socioeconomic History   Marital status: Single    Spouse name: Not on file   Number of children: Not on file   Years of education: Not on file   Highest education level: Not on file  Occupational History   Not on file  Tobacco Use  Smoking status: Never   Smokeless tobacco: Never  Vaping Use   Vaping Use: Never used  Substance and Sexual Activity   Alcohol use: No   Drug use: No   Sexual activity: Yes    Birth control/protection: None  Other Topics Concern   Not on file  Social History Narrative   Not on file   Social Determinants of Health   Financial Resource Strain: Not on file  Food Insecurity: Not on file  Transportation Needs: Not on file  Physical Activity: Not on file  Stress: Not on file  Social Connections: Not on file  Intimate Partner Violence: Not on file    Family History  Problem Relation Age of Onset   Hypertension Mother    Asthma Mother    Diabetes  Father     Past Surgical History:  Procedure Laterality Date   CESAREAN SECTION      ROS: Review of Systems Negative except as stated above  PHYSICAL EXAM: BP 116/76 (BP Location: Left Arm, Patient Position: Sitting, Cuff Size: Large)    Pulse 79    Temp 98.3 F (36.8 C)    Resp 18    Ht 5' 6.73" (1.695 m)    Wt 192 lb (87.1 kg)    SpO2 98%    BMI 30.31 kg/m   Physical Exam HENT:     Head: Normocephalic and atraumatic.     Right Ear: Tympanic membrane, ear canal and external ear normal.     Left Ear: Tympanic membrane, ear canal and external ear normal.  Eyes:     Extraocular Movements: Extraocular movements intact.     Conjunctiva/sclera: Conjunctivae normal.     Pupils: Pupils are equal, round, and reactive to light.  Cardiovascular:     Rate and Rhythm: Normal rate and regular rhythm.     Pulses: Normal pulses.     Heart sounds: Normal heart sounds.  Pulmonary:     Effort: Pulmonary effort is normal.     Breath sounds: Normal breath sounds.  Chest:     Comments: Patient declined exam.  Abdominal:     General: Bowel sounds are normal.     Palpations: Abdomen is soft.  Genitourinary:    Comments: Patient declined exam.  Musculoskeletal:        General: Normal range of motion.     Cervical back: Normal range of motion and neck supple.  Skin:    General: Skin is warm and dry.     Capillary Refill: Capillary refill takes less than 2 seconds.  Neurological:     General: No focal deficit present.     Mental Status: She is alert and oriented to person, place, and time.  Psychiatric:        Mood and Affect: Mood normal.        Behavior: Behavior normal.    ASSESSMENT AND PLAN: 1. Annual physical exam: - Counseled on 150 minutes of exercise per week as tolerated, healthy eating (including decreased daily intake of saturated fats, cholesterol, added sugars, sodium), STI prevention, and routine healthcare maintenance.  2. Screening for metabolic disorder: -  VFI43+PIRJ to check kidney function, liver function, and electrolyte balance.  - CMP14+EGFR  3. Screening for deficiency anemia: - CBC to screen for anemia. - CBC  4. Diabetes mellitus screening: - Hemoglobin A1c to screen for pre-diabetes/diabetes. - Hemoglobin A1c  5. Screening cholesterol level: - Lipid panel to screen for high cholesterol.  - Lipid panel  6. Thyroid disorder screen: -  TSH to check thyroid function.  - TSH  7. Encounter for screening mammogram for malignant neoplasm of breast: - Referral for breast cancer screening by mammogram.  - MM Digital Screening; Future  8. Colon cancer screening: - Cologuard for colon cancer screening.  - Cologuard  9. Need for immunization against influenza: - Administered today in office.  - Flu Vaccine QUAD 84moIM (Fluarix, Fluzone & Alfiuria Quad PF)  10. Chronic pain of both lower extremities: - Referral to Orthopedic Surgery for further evaluation and management.  - Ambulatory referral to Orthopedic Surgery  11. Insomnia, unspecified type: - Insomnia seems secondary to 1 year anniversary of her father's decease. Patient endorses some anxiety related to the same, declines pharmacological management for anxiety. Patient denies thoughts of self-harm, suicidal ideations, homicidal ideations. - Begin Trazodone as prescribed. Counseled on medication compliance and adverse effects.  - Follow-up with primary provider in 4 weeks or sooner if needed.  - traZODone (DESYREL) 50 MG tablet; Take 0.5-1 tablets (25-50 mg total) by mouth at bedtime as needed for sleep.  Dispense: 30 tablet; Refill: 0  12. Menopause: - Patient declined referral to Gynecology.  - Counseled on red flags/symptoms and to follow-up with primary provider as scheduled. Patient verbalized understanding.    Patient was given the opportunity to ask questions.  Patient verbalized understanding of the plan and was able to repeat key elements of the plan. Patient was  given clear instructions to go to Emergency Department or return to medical center if symptoms don't improve, worsen, or new problems develop.The patient verbalized understanding.   Orders Placed This Encounter  Procedures   MM Digital Screening   Flu Vaccine QUAD 657moM (Fluarix, Fluzone & Alfiuria Quad PF)   CBC   Lipid panel   TSH   Hemoglobin A1c   CMP14+EGFR   Cologuard   Ambulatory referral to Orthopedic Surgery     Requested Prescriptions   Signed Prescriptions Disp Refills   traZODone (DESYREL) 50 MG tablet 30 tablet 0    Sig: Take 0.5-1 tablets (25-50 mg total) by mouth at bedtime as needed for sleep.    Return in about 1 year (around 02/25/2022) for Physical per patient preference.  AmCamillia HerterNP

## 2021-02-25 ENCOUNTER — Encounter: Payer: Self-pay | Admitting: Family

## 2021-02-25 ENCOUNTER — Other Ambulatory Visit: Payer: Self-pay | Admitting: Family

## 2021-02-25 ENCOUNTER — Ambulatory Visit (INDEPENDENT_AMBULATORY_CARE_PROVIDER_SITE_OTHER): Payer: BC Managed Care – PPO | Admitting: Family

## 2021-02-25 ENCOUNTER — Other Ambulatory Visit: Payer: Self-pay

## 2021-02-25 ENCOUNTER — Telehealth: Payer: Self-pay | Admitting: Family

## 2021-02-25 VITALS — BP 116/76 | HR 79 | Temp 98.3°F | Resp 18 | Ht 66.73 in | Wt 192.0 lb

## 2021-02-25 DIAGNOSIS — Z23 Encounter for immunization: Secondary | ICD-10-CM | POA: Diagnosis not present

## 2021-02-25 DIAGNOSIS — M79604 Pain in right leg: Secondary | ICD-10-CM | POA: Diagnosis not present

## 2021-02-25 DIAGNOSIS — Z0000 Encounter for general adult medical examination without abnormal findings: Secondary | ICD-10-CM | POA: Diagnosis not present

## 2021-02-25 DIAGNOSIS — G5601 Carpal tunnel syndrome, right upper limb: Secondary | ICD-10-CM

## 2021-02-25 DIAGNOSIS — Z13228 Encounter for screening for other metabolic disorders: Secondary | ICD-10-CM

## 2021-02-25 DIAGNOSIS — G4700 Insomnia, unspecified: Secondary | ICD-10-CM | POA: Diagnosis not present

## 2021-02-25 DIAGNOSIS — Z13220 Encounter for screening for lipoid disorders: Secondary | ICD-10-CM

## 2021-02-25 DIAGNOSIS — Z1231 Encounter for screening mammogram for malignant neoplasm of breast: Secondary | ICD-10-CM

## 2021-02-25 DIAGNOSIS — Z131 Encounter for screening for diabetes mellitus: Secondary | ICD-10-CM

## 2021-02-25 DIAGNOSIS — M79605 Pain in left leg: Secondary | ICD-10-CM

## 2021-02-25 DIAGNOSIS — Z130 Encounter for screening for diseases of the blood and blood-forming organs and certain disorders involving the immune mechanism: Secondary | ICD-10-CM

## 2021-02-25 DIAGNOSIS — Z780 Asymptomatic menopausal state: Secondary | ICD-10-CM

## 2021-02-25 DIAGNOSIS — G8929 Other chronic pain: Secondary | ICD-10-CM

## 2021-02-25 DIAGNOSIS — Z0001 Encounter for general adult medical examination with abnormal findings: Secondary | ICD-10-CM

## 2021-02-25 DIAGNOSIS — Z1211 Encounter for screening for malignant neoplasm of colon: Secondary | ICD-10-CM

## 2021-02-25 DIAGNOSIS — Z1329 Encounter for screening for other suspected endocrine disorder: Secondary | ICD-10-CM

## 2021-02-25 MED ORDER — TRAZODONE HCL 50 MG PO TABS: 25.0000 mg | ORAL_TABLET | Freq: Every evening | ORAL | 0 refills | Status: DC | PRN

## 2021-02-25 MED ORDER — GABAPENTIN 300 MG PO CAPS: 300.0000 mg | ORAL_CAPSULE | Freq: Every day | ORAL | 0 refills | Status: DC

## 2021-02-25 NOTE — Patient Instructions (Signed)

## 2021-02-25 NOTE — Telephone Encounter (Signed)
gabapentin (NEURONTIN) 300 MG capsule [[id number]]   [facility] [place] [address]  [address] ([place]) [place] [address]  Phone:  [phone]  Fax:  [phone]

## 2021-02-25 NOTE — Progress Notes (Signed)
Pt presents for annual physical exam, pt complains of bilateral leg pain and insomnia

## 2021-02-25 NOTE — Telephone Encounter (Signed)
Gabapentin refilled per patient request

## 2021-02-26 ENCOUNTER — Encounter: Payer: Self-pay | Admitting: Family

## 2021-02-26 DIAGNOSIS — R7303 Prediabetes: Secondary | ICD-10-CM | POA: Insufficient documentation

## 2021-02-26 LAB — CBC
Hematocrit: 35.1 % (ref 34.0–46.6)
Hemoglobin: 11.5 g/dL (ref 11.1–15.9)
MCH: 29.9 pg (ref 26.6–33.0)
MCHC: 32.8 g/dL (ref 31.5–35.7)
MCV: 91 fL (ref 79–97)
Platelets: 309 10*3/uL (ref 150–450)
RBC: 3.84 x10E6/uL (ref 3.77–5.28)
RDW: 12.7 % (ref 11.7–15.4)
WBC: 4.1 10*3/uL (ref 3.4–10.8)

## 2021-02-26 LAB — CMP14+EGFR
ALT: 18 IU/L (ref 0–32)
AST: 19 IU/L (ref 0–40)
Albumin/Globulin Ratio: 1.9 (ref 1.2–2.2)
Albumin: 4.7 g/dL (ref 3.8–4.8)
Alkaline Phosphatase: 90 IU/L (ref 44–121)
BUN/Creatinine Ratio: 15 (ref 9–23)
BUN: 10 mg/dL (ref 6–24)
Bilirubin Total: 0.3 mg/dL (ref 0.0–1.2)
CO2: 23 mmol/L (ref 20–29)
Calcium: 9.2 mg/dL (ref 8.7–10.2)
Chloride: 98 mmol/L (ref 96–106)
Creatinine, Ser: 0.68 mg/dL (ref 0.57–1.00)
Globulin, Total: 2.5 g/dL (ref 1.5–4.5)
Glucose: 91 mg/dL (ref 70–99)
Potassium: 4.1 mmol/L (ref 3.5–5.2)
Sodium: 138 mmol/L (ref 134–144)
Total Protein: 7.2 g/dL (ref 6.0–8.5)
eGFR: 108 mL/min/{1.73_m2} (ref >59)

## 2021-02-26 LAB — HEMOGLOBIN A1C
Est. average glucose Bld gHb Est-mCnc: 123 mg/dL
Hgb A1c MFr Bld: 5.9 % — ABNORMAL HIGH (ref 4.8–5.6)

## 2021-02-26 LAB — LIPID PANEL
Chol/HDL Ratio: 3.3 ratio (ref 0.0–4.4)
Cholesterol, Total: 158 mg/dL (ref 100–199)
HDL: 48 mg/dL (ref >39)
LDL Chol Calc (NIH): 100 mg/dL — ABNORMAL HIGH (ref 0–99)
Triglycerides: 50 mg/dL (ref 0–149)
VLDL Cholesterol Cal: 10 mg/dL (ref 5–40)

## 2021-02-26 LAB — TSH: TSH: 0.921 u[IU]/mL (ref 0.450–4.500)

## 2021-02-26 NOTE — Progress Notes (Signed)
Kidney function normal.  Liver function normal.  Thyroid function normal.   No anemia.  Hemoglobin A1c is consistent with pre-diabetes. Practice healthy eating habits of fresh fruit and vegetables, lean baked meats such as chicken, fish, and [place]; limit breads, rice, pastas, and desserts; practice regular aerobic exercise (at least 150 minutes a week as tolerated). No medication needed at the moment. Encouraged to recheck pre-diabetes in 6 months.   LDL cholesterol (sometimes called "bad cholesterol") slightly higher than expected. High cholesterol may increase risk of heart attack and/or stroke. Consider eating more fruits, vegetables, and lean baked meats such as chicken or fish. Moderate intensity exercise at least 150 minutes as tolerated per week may help as well. No medication needed at the moment. Encouraged to recheck fasting cholesterol routinely.

## 2021-03-05 ENCOUNTER — Ambulatory Visit: Payer: BC Managed Care – PPO | Admitting: Orthopaedic Surgery

## 2021-03-14 ENCOUNTER — Ambulatory Visit
Admission: RE | Admit: 2021-03-14 | Discharge: 2021-03-14 | Disposition: A | Discharge status: Home / Self Care | Payer: 59 | Source: Ambulatory Visit | Attending: Family | Admitting: Family

## 2021-03-14 DIAGNOSIS — Z1231 Encounter for screening mammogram for malignant neoplasm of breast: Secondary | ICD-10-CM

## 2021-03-15 LAB — COLOGUARD: Cologuard: NEGATIVE

## 2021-03-17 NOTE — Progress Notes (Signed)
Patient ID: Sheila Weber, female    DOB: [birth year]  MRN: [id number]  CC: Insomnia Follow-Up  Subjective: Sheila Weber is a 46 y.o. female who presents for insomnia follow-up.   Her concerns today include:  INSOMNIA FOLLOW-UP: 02/25/2021: - Begin Trazodone as prescribed. Counseled on medication compliance and adverse effects.   03/22/2021: Trazodone helping but feels could still use a little more help with sleep.   2. ACID REFLUX: Usually happens at nighttime. Feels like fire in the throat. Unable to identify any foods or beverages as the cause. For the past several years may cough up some blood once in the morning. Not ready for referral as of present. Taking over-the-counter Tums not helping.  Dysphagia: no Odynophagia:  no Blood in stool: no   Patient Active Problem List   Diagnosis Date Noted   Prediabetes 02/26/2021     Current Outpatient Medications on File Prior to Visit  Medication Sig Dispense Refill   cyclobenzaprine (FLEXERIL) 10 MG tablet Take 1 tablet (10 mg total) by mouth 2 (two) times daily as needed for muscle spasms. 30 tablet 0   gabapentin (NEURONTIN) 300 MG capsule Take 1 capsule (300 mg total) by mouth daily. 90 capsule 0   levonorgestrel-ethinyl estradiol (AVIANE) 0.1-20 MG-MCG tablet Take 1 tablet by mouth daily. 28 tablet 11   Meloxicam 15 MG TBDP One qd for pain and inflammation 30 tablet 0   naproxen (NAPROSYN) 500 MG tablet Take 1 tablet (500 mg total) by mouth 2 (two) times daily with a meal. 30 tablet 0   No current facility-administered medications on file prior to visit.    No Known Allergies  Social History   Socioeconomic History   Marital status: Single    Spouse name: Not on file   Number of children: Not on file   Years of education: Not on file   Highest education level: Not on file  Occupational History   Not on file  Tobacco Use   Smoking status: Never   Smokeless tobacco: Never  Vaping Use   Vaping Use: Never used   Substance and Sexual Activity   Alcohol use: No   Drug use: No   Sexual activity: Yes    Birth control/protection: None  Other Topics Concern   Not on file  Social History Narrative   Not on file   Social Determinants of Health   Financial Resource Strain: Not on file  Food Insecurity: Not on file  Transportation Needs: Not on file  Physical Activity: Not on file  Stress: Not on file  Social Connections: Not on file  Intimate Partner Violence: Not on file    Family History  Problem Relation Age of Onset   Hypertension Mother    Asthma Mother    Diabetes Father     Past Surgical History:  Procedure Laterality Date   CESAREAN SECTION      ROS: Review of Systems Negative except as stated above  PHYSICAL EXAM: BP 124/86 (BP Location: Left Arm, Patient Position: Sitting, Cuff Size: Normal)    Pulse 73    Temp 98.3 F (36.8 C)    Resp 18    Ht 5' 6.73" (1.695 m)    Wt 193 lb (87.5 kg)    SpO2 100%    BMI 30.47 kg/m   Physical Exam HENT:     Head: Normocephalic and atraumatic.     Nose: Nose normal.     Mouth/Throat:     Mouth: Mucous membranes are  moist.     Pharynx: Oropharynx is clear.  Eyes:     Extraocular Movements: Extraocular movements intact.     Conjunctiva/sclera: Conjunctivae normal.     Pupils: Pupils are equal, round, and reactive to light.  Cardiovascular:     Rate and Rhythm: Normal rate and regular rhythm.     Pulses: Normal pulses.     Heart sounds: Normal heart sounds.  Pulmonary:     Effort: Pulmonary effort is normal.     Breath sounds: Normal breath sounds.  Musculoskeletal:     Cervical back: Normal range of motion and neck supple.  Neurological:     General: No focal deficit present.     Mental Status: She is alert and oriented to person, place, and time.  Psychiatric:        Mood and Affect: Mood normal.        Behavior: Behavior normal.   ASSESSMENT AND PLAN: 1. Insomnia, unspecified type: - Increase Trazodone from 50 mg to  100 mg.  - Follow-up with primary provider in 4 weeks or sooner if needed.  - traZODone (DESYREL) 100 MG tablet; Take 1 tablet (100 mg total) by mouth at bedtime as needed for sleep.  Dispense: 30 tablet; Refill: 1  2. Gastroesophageal reflux disease, unspecified whether esophagitis present: - Omeprazole as prescribed. Counseled on medication compliance and adverse effects. - You may feel better if you: ?Avoid foods that make your symptoms worse - For some people these include coffee, chocolate, alcohol, peppermint, and fatty foods. ?Avoid late meals - Lying down with a full stomach can make reflux worse. Try to plan meals for at least 2 to 3 hours before bedtime. - Patient declined referral to Gastroenterology. Counseled if no improvement in symptoms at follow-up then will refer at that time. Patient agreeable. - Follow-up with primary provider in 4 weeks or sooner if needed.  - omeprazole (PRILOSEC) 20 MG capsule; Take 1 capsule (20 mg total) by mouth daily.  Dispense: 90 capsule; Refill: 0    Patient was given the opportunity to ask questions.  Patient verbalized understanding of the plan and was able to repeat key elements of the plan. Patient was given clear instructions to go to Emergency Department or return to medical center if symptoms don't improve, worsen, or new problems develop.The patient verbalized understanding.   Requested Prescriptions   Signed Prescriptions Disp Refills   omeprazole (PRILOSEC) 20 MG capsule 90 capsule 0    Sig: Take 1 capsule (20 mg total) by mouth daily.   traZODone (DESYREL) 100 MG tablet 30 tablet 1    Sig: Take 1 tablet (100 mg total) by mouth at bedtime as needed for sleep.    Return in about 4 weeks (around 04/19/2021) for Follow-Up or next available acid reflux, insomnia.  Camillia Herter, NP

## 2021-03-22 ENCOUNTER — Other Ambulatory Visit: Payer: Self-pay

## 2021-03-22 ENCOUNTER — Encounter: Payer: Self-pay | Admitting: Family

## 2021-03-22 ENCOUNTER — Ambulatory Visit (INDEPENDENT_AMBULATORY_CARE_PROVIDER_SITE_OTHER): Payer: 59 | Admitting: Family

## 2021-03-22 VITALS — BP 124/86 | HR 73 | Temp 98.3°F | Resp 18 | Ht 66.73 in | Wt 193.0 lb

## 2021-03-22 DIAGNOSIS — K219 Gastro-esophageal reflux disease without esophagitis: Secondary | ICD-10-CM

## 2021-03-22 DIAGNOSIS — G4700 Insomnia, unspecified: Secondary | ICD-10-CM | POA: Diagnosis not present

## 2021-03-22 LAB — COLOGUARD: COLOGUARD: NEGATIVE

## 2021-03-22 MED ORDER — OMEPRAZOLE 20 MG PO CPDR: 20.0000 mg | DELAYED_RELEASE_CAPSULE | Freq: Every day | ORAL | 0 refills | Status: DC

## 2021-03-22 MED ORDER — TRAZODONE HCL 100 MG PO TABS: 100.0000 mg | ORAL_TABLET | Freq: Every evening | ORAL | 1 refills | Status: DC | PRN

## 2021-03-22 NOTE — Progress Notes (Signed)
Cologuard negative.

## 2021-03-22 NOTE — Progress Notes (Signed)
Pt presents for insomnia follow-up, states that she has been dealing with bloating and acid reflux, takes Tums at times

## 2021-03-26 ENCOUNTER — Ambulatory Visit: Payer: Self-pay

## 2021-03-26 ENCOUNTER — Encounter: Payer: Self-pay | Admitting: Orthopaedic Surgery

## 2021-03-26 ENCOUNTER — Other Ambulatory Visit: Payer: Self-pay

## 2021-03-26 ENCOUNTER — Ambulatory Visit (INDEPENDENT_AMBULATORY_CARE_PROVIDER_SITE_OTHER): Payer: 59 | Admitting: Orthopaedic Surgery

## 2021-03-26 DIAGNOSIS — M5441 Lumbago with sciatica, right side: Secondary | ICD-10-CM | POA: Diagnosis not present

## 2021-03-26 DIAGNOSIS — M5442 Lumbago with sciatica, left side: Secondary | ICD-10-CM | POA: Diagnosis not present

## 2021-03-26 DIAGNOSIS — G8929 Other chronic pain: Secondary | ICD-10-CM

## 2021-03-26 DIAGNOSIS — M545 Low back pain, unspecified: Secondary | ICD-10-CM

## 2021-03-26 NOTE — Progress Notes (Signed)
Office Visit Note   Patient: Sheila Weber           Date of Birth: [birth year]           MRN: [id number] Visit Date: 03/26/2021              Requested by: Camillia Herter, NP [place],  [place] [address] PCP: Camillia Herter, NP   Assessment & Plan: Visit Diagnoses:  1. Chronic midline low back pain, unspecified whether sciatica present   2. Chronic bilateral low back pain with bilateral sciatica     Plan: 46 year old female with a chronic history of low back pain sometimes associated with bilateral lower extremity discomfort more on the right than the left.  She has not had any bowel or bladder changes.  She has had some discomfort off and on for at least 5 years and it might be related to working at [employer] where she is standing pretty much all day and lifting.  She presently is on gabapentin 100 mg each evening and she is not sure its made much of the difference.  She is also tried over-the-counter medicines which gives her some relief.  She notes that she has not had too much trouble for the past 3 months but her pain seems to be episodic.  She is having more trouble with her back than she is with her legs.  Films reveal minimal degenerative changes at the facet joints of L4-5 and L5-S1.  Her exam was otherwise benign.  I think a course of physical therapy would be very helpful Georgina Peer like to check her again in about a month.  At some point we may want to consider an MRI scan but there is no evidence of any neurologic deficit  Follow-Up Instructions: Return in about 1 month (around 04/23/2021).   Orders:  Orders Placed This Encounter  Procedures   XR Lumbar Spine 2-3 Views   Ambulatory referral to Physical Therapy   No orders of the defined types were placed in this encounter.     Procedures: No procedures performed   Clinical Data: No additional findings.   Subjective: Chief Complaint  Patient presents with   Lower Back - Pain  Patient presents  today for lower back pain. She said that she has been having pain for years, along with numbness in both legs and also her right arm. She said that there is no known injury. She right side is worse than the left. She is worse upon getting home after working long hours on her feet. She saw her PCP and was given Gabapentin to take. She does states that she has "lots of back pain". She does not report any neck pain.   HPI  Review of Systems   Objective: Vital Signs: There were no vitals taken for this visit.  Physical Exam Constitutional:      Appearance: She is well-developed.  Eyes:     Pupils: Pupils are equal, round, and reactive to light.  Pulmonary:     Effort: Pulmonary effort is normal.  Skin:    General: Skin is warm and dry.  Neurological:     Mental Status: She is alert and oriented to person, place, and time.  Psychiatric:        Behavior: Behavior normal.    Ortho Exam awake alert and oriented x3.  Comfortable sitting.  Straight leg raise negative bilaterally.  Reflexes were symmetrical.  Excellent strength.  Normal sensation.  No  pain with range of motion of either hip or knee.  No percussible tenderness of the lumbar spine.  No pain over either SI joint.  Leg lengths appear to be symmetrical.  No flank pain  Specialty Comments:  No specialty comments available.  Imaging: XR Lumbar Spine 2-3 Views  Result Date: 03/26/2021 Films lumbar spine in 2 projections and compared to films from 2019.  There is minimal facet sclerosis at L4-5 and L5-S1.  No scoliosis or listhesis.  Disc spaces are well-maintained.  No evidence of compression fracture    PMFS History: Patient Active Problem List   Diagnosis Date Noted   Low back pain 03/26/2021   Prediabetes 02/26/2021   Past Medical History:  Diagnosis Date   Anxiety    Chronic back pain greater than 3 months duration    Depression     Family History  Problem Relation Age of Onset   Hypertension Mother    Asthma  Mother    Diabetes Father     Past Surgical History:  Procedure Laterality Date   CESAREAN SECTION     Social History   Occupational History   Not on file  Tobacco Use   Smoking status: Never   Smokeless tobacco: Never  Vaping Use   Vaping Use: Never used  Substance and Sexual Activity   Alcohol use: No   Drug use: No   Sexual activity: Yes    Birth control/protection: None

## 2021-03-28 ENCOUNTER — Other Ambulatory Visit: Payer: Self-pay | Admitting: Family

## 2021-03-28 DIAGNOSIS — N6310 Unspecified lump in the right breast, unspecified quadrant: Secondary | ICD-10-CM

## 2021-04-02 NOTE — Therapy (Signed)
?OUTPATIENT PHYSICAL THERAPY THORACOLUMBAR EVALUATION ? ? ?Patient Name: Sheila Weber ?MRN: [id number] ?DOB:[birth year], 46 y.o., female ?Today's Date: 04/03/2021 ? ? PT End of Session - 04/03/21 1607   ? ? Visit Number 1   ? Number of Visits 10   ? Date for PT Re-Evaluation 05/15/21   ? PT Start Time 1519   ? PT Stop Time 1600   ? PT Time Calculation (min) 41 min   ? Activity Tolerance Patient tolerated treatment well   ? Behavior During Therapy [facility] for tasks assessed/performed   ? ?  ?  ? ?  ? ? ?Past Medical History:  ?Diagnosis Date  ? Anxiety   ? Chronic back pain greater than 3 months duration   ? Depression   ? ?Past Surgical History:  ?Procedure Laterality Date  ? CESAREAN SECTION    ? ?Patient Active Problem List  ? Diagnosis Date Noted  ? Low back pain 03/26/2021  ? Prediabetes 02/26/2021  ? ? ?PCP: Camillia Herter, NP ? ?REFERRING PROVIDER: Garald Balding, MD ? ?REFERRING DIAG: M54.50,G89.29 (ICD-10-CM) - Chronic midline low back pain, unspecified whether sciatica present ? ?THERAPY DIAG:  ?Chronic bilateral low back pain with bilateral sciatica ? ?Muscle weakness (generalized) ? ?Abnormal posture ? ?ONSET DATE: Chronic pain more than one year ? ?SUBJECTIVE:                                                                                                                                                                                          ? ?SUBJECTIVE STATEMENT: She says back pain >1 year that can run down her legs. No known cause of this pain ? ?PERTINENT HISTORY:  ?DGL:OVFIEPP LBP,anx,dep ? ?PAIN:  ?Are you having pain? Yes ?NPRS scale: 2/10 now but can get up to 10 ?Pain location: low back and down both legs to her toes but most often on Rt ?Pain orientation: Bilateral  ?PAIN TYPE: aching, sharp, and tingling ?Pain description: constant pain in back and intermittent radicular pain ?Aggravating factors: standing at work, bending ?Relieving factors: sit with a pillow, meds, heat ? ?PRECAUTIONS:  None ? ?WEIGHT BEARING RESTRICTIONS No ? ?FALLS:  ?Has patient fallen in last 6 months? No, Number of falls: 0 ? ?OCCUPATION: standing work at [employer] ? ?PLOF: Independent ? ?PATIENT GOALS : reduce pain in back and down legs ? ? ?OBJECTIVE:  ? ?DIAGNOSTIC FINDINGS:  ?03/26/21 "Films lumbar spine in 2 projections and compared to films from 2019.  There is minimal facet sclerosis at L4-5 and L5-S1.  No scoliosis or listhesis.  Disc spaces are well-maintained.  No evidence of compression fracture" ? ?PATIENT SURVEYS:  ?  FOTO 53% functional ? ?COGNITION: ? Overall cognitive status: Within functional limits for tasks assessed   ?  ?SENSATION: ? Light touch: Appears intact ?  ? ?MUSCLE LENGTH: ?Hamstrings: Right 50 deg; Left 75 deg ? ? ?POSTURE:  ?Anterior pelvic tilt ? ?PALPATION: ?TTP L4-5 ?Increased muscle tension in lumbar paraspinals ? ?LUMBARAROM/PROM ? ?A/PROM A/PROM  ?04/03/2021  ?Flexion 75% and painful  ?Extension 50% and painful  ?Right lateral flexion 50%  ?Left lateral flexion 50%  ?Right rotation 50%  ?Left rotation 50%  ? (Blank rows = not tested) ? ?Decreased overall ROM in bilat hips ? ?LE MMT: ? ?MMT Right ?04/03/2021 Left ?04/03/2021  ?Hip flexion 4 4+  ?Hip extension    ?Hip abduction 4 4  ?Hip adduction    ?Hip internal rotation    ?Hip external rotation    ?Knee flexion 5 5  ?Knee extension 4+ 5  ?Ankle dorsiflexion 4+ 4+  ?Ankle plantarflexion    ?Ankle inversion    ?Ankle eversion    ? (Blank rows = not tested) ? ?LUMBAR SPECIAL TESTS:  ?Negative slump test bilat, + quadrant test bilat, + SLR on Rt that was negative on lefts ? ? ?GAIT: ?WNL ? ? ? ?TODAY'S TREATMENT  ?Reviewed HEP listed below ?Moist heat and IFC TENS therapy to low back X 12 min ? ? ?PATIENT EDUCATION:  ?Education details: HEP,POC,TENS ?Person educated: Patient ?Education method: Explanation, Demonstration, Verbal cues, and Handouts ?Education comprehension: verbalized understanding and needs further education ? ? ?HOME EXERCISE  PROGRAM: ?Access Code: [email or web address] ?URL: [email or web address] ?Date: 04/03/2021 ?Prepared by: Elsie Ra ? ?Exercises ?Standing Posterior Pelvic Tilt - 2 x daily - 6 x weekly - 1 sets - 15-20 reps ?Standing Lumbar Spine Flexion Stretch Counter - 2 x daily - 6 x weekly - 1 sets - 5 reps - 10 hold ?Hooklying Single Knee to Chest Stretch - 2 x daily - 6 x weekly - 1 sets - 2 reps - 20 hold ?Supine Bridge - 2 x daily - 6 x weekly - 1-2 sets - 10 reps - 5 hold ?Seated Hamstring Stretch - 2 x daily - 6 x weekly - 1 sets - 2 reps - 30 hold ?Deadlift with Resistance - 2 x daily - 6 x weekly - 1-2 sets - 10 reps ?Standing Anti-Rotation Press with Anchored Resistance - 2 x daily - 6 x weekly - 1-2 sets - 10 reps ? ?Patient Education ?TENS Therapy ? ?ASSESSMENT: ? ?CLINICAL IMPRESSION: Patient presents with signs and symptoms consistent with Chronic LBP with intermittent bilateral radiculopaty with ROM deficits, decreased core/hip sstrength,increased muscle tension in paraspinals, facet arthopathy and increased lordosis from anterior pelvic tilt. Patient will benefit from skilled PT to address below impairments and improve overall function. ? ?OBJECTIVE IMPAIRMENTS: decreased activity tolerance, difficulty walking, decreased balance, decreased endurance, decreased mobility, decreased ROM, decreased strength, impaired flexibility, impaired LE use, postural dysfunction, and pain. ? ?ACTIVITY LIMITATIONS: bending, lifting, carry, locomotion, cleaning, community activity, driving, and or occupation ? ?PERSONAL FACTORS: OA, anx, dep, chronic pain, are also affecting patient's functional outcome. ? ? ?REHAB POTENTIAL: Good ? ?CLINICAL DECISION MAKING: stable/uncomplicated ? ?EVALUATION COMPLEXITY: Low ? ? ? ?GOALS: ? ? ?Long term PT goals (target dates for all long term goals are 6 weeks 05/15/21) ?Pt will improve lumbar and hip ROM to [facility] to improve functional mobility ?Baseline: ?Goal status: New ?Pt will improve   hip/knee strength to at least 5-/5 MMT to improve functional strength ?Baseline: ?Goal status: New ?Pt  will improve FOTO to at least 59% functional to show improved function ?Baseline: ?Goal status: New ?Pt will reduce pain by overall 50% overall with usual activity ?Baseline: ?Goal status: New ?Pt will reduce pain to overall less than 2-3/10 with usual activity and work activity. ?Baseline: ?Goal status: New ?Pt will be independent and compliant with HEP ? Goal status: New ?PLAN: ?PT FREQUENCY: 1-2 times per week  ? ?PT DURATION: 6 weeks ? ?PLANNED INTERVENTIONS (unless contraindicated): aquatic PT, Canalith repositioning, cryotherapy, Electrical stimulation, Iontophoresis with 4 mg/ml dexamethasome, Moist heat, traction, Ultrasound, gait training, Therapeutic exercise, balance training, neuromuscular re-education, patient/family education, prosthetic training, manual techniques, passive ROM, dry needling, taping, vasopnuematic device, vestibular, spinal manipulations, joint manipulations ? ?PLAN FOR NEXT SESSION: review HEP, how was TENS, consider DN  ? ? ? ?Debbe Odea, PT,DPT ?04/03/2021, 4:08 PM ? ?

## 2021-04-03 ENCOUNTER — Other Ambulatory Visit: Payer: Self-pay

## 2021-04-03 ENCOUNTER — Encounter: Payer: Self-pay | Admitting: Physical Therapy

## 2021-04-03 ENCOUNTER — Ambulatory Visit: Payer: 59 | Admitting: Physical Therapy

## 2021-04-03 DIAGNOSIS — M6281 Muscle weakness (generalized): Secondary | ICD-10-CM | POA: Diagnosis not present

## 2021-04-03 DIAGNOSIS — M5441 Lumbago with sciatica, right side: Secondary | ICD-10-CM | POA: Diagnosis not present

## 2021-04-03 DIAGNOSIS — M5442 Lumbago with sciatica, left side: Secondary | ICD-10-CM

## 2021-04-03 DIAGNOSIS — R293 Abnormal posture: Secondary | ICD-10-CM

## 2021-04-03 DIAGNOSIS — G8929 Other chronic pain: Secondary | ICD-10-CM

## 2021-04-12 ENCOUNTER — Encounter: Payer: Self-pay | Admitting: Rehabilitative and Restorative Service Providers"

## 2021-04-12 ENCOUNTER — Other Ambulatory Visit: Payer: Self-pay

## 2021-04-12 ENCOUNTER — Ambulatory Visit: Payer: 59 | Admitting: Rehabilitative and Restorative Service Providers"

## 2021-04-12 DIAGNOSIS — M6281 Muscle weakness (generalized): Secondary | ICD-10-CM

## 2021-04-12 DIAGNOSIS — M5442 Lumbago with sciatica, left side: Secondary | ICD-10-CM

## 2021-04-12 DIAGNOSIS — M5441 Lumbago with sciatica, right side: Secondary | ICD-10-CM

## 2021-04-12 DIAGNOSIS — G8929 Other chronic pain: Secondary | ICD-10-CM

## 2021-04-12 DIAGNOSIS — R293 Abnormal posture: Secondary | ICD-10-CM | POA: Diagnosis not present

## 2021-04-12 NOTE — Therapy (Signed)
?OUTPATIENT PHYSICAL THERAPY TREATMENT NOTE ? ? ?Patient Name: Sheila Weber ?MRN: [id number] ?DOB:1976/01/11, 46 y.o., female ?Today's Date: 04/12/2021 ? ? PT End of Session - 04/12/21 1357   ? ? Visit Number 2   ? Number of Visits 10   ? Date for PT Re-Evaluation 05/15/21   ? PT Start Time 1341   ? PT Stop Time 2595   ? PT Time Calculation (min) 38 min   ? Activity Tolerance Patient tolerated treatment well   ? Behavior During Therapy [facility] for tasks assessed/performed   ? ?  ?  ? ?  ? ? ?Past Medical History:  ?Diagnosis Date  ? Anxiety   ? Chronic back pain greater than 3 months duration   ? Depression   ? ?Past Surgical History:  ?Procedure Laterality Date  ? CESAREAN SECTION    ? ?Patient Active Problem List  ? Diagnosis Date Noted  ? Low back pain 03/26/2021  ? Prediabetes 02/26/2021  ? ?REFERRING PROVIDER: Garald Balding, MD ?  ?REFERRING DIAG: M54.50,G89.29 (ICD-10-CM) - Chronic midline low back pain, unspecified whether sciatica present ? ?ONSET DATE: Chronic pain more than one year ? ?THERAPY DIAG:  ?Chronic bilateral low back pain with bilateral sciatica ? ?Muscle weakness (generalized) ? ?Abnormal posture ? ?PERTINENT HISTORY: GLO:VFIEPPI LBP,anx,dep ? ?PRECAUTIONS: None ? ?SUBJECTIVE: Pt indicated 2/10 today.  Pt indicated having pain c bridge with some mild improvement c use.  ? ?PAIN:  ?Are you having pain? Yes ?NPRS scale: 2/10 now but can get up to 10 ?Pain location: low back and down both legs to her toes but most often on Rt ?Pain orientation: Bilateral  ?PAIN TYPE: aching, sharp, and tingling ?Pain description: constant pain in back and intermittent radicular pain ?Aggravating factors: standing at work, bending ?Relieving factors: sit with a pillow, meds, heat ? ? ? ? ? ?OBJECTIVE:  ?  ?DIAGNOSTIC FINDINGS:  ?04/03/2021  "Films lumbar spine in 2 projections and compared to films from 2019.  There is minimal facet sclerosis at L4-5 and L5-S1.  No scoliosis or listhesis.  Disc spaces are  well-maintained.  No evidence of compression fracture" ?  ?PATIENT SURVEYS:  ?04/03/2021 FOTO 53% functional ?  ?COGNITION: ?         04/03/2021 Overall cognitive status: Within functional limits for tasks assessed              ?          ?SENSATION: ?         04/03/2021 Light touch: Appears intact ?          ?  ?MUSCLE LENGTH: ?04/03/2021 ?Hamstrings: Right 50 deg; Left 75 deg ?  ?  ?POSTURE:  ?04/03/2021 ?Anterior pelvic tilt ?  ?PALPATION: ?04/03/2021 ?TTP L4-5 ?Increased muscle tension in lumbar paraspinals ?  ?LUMBARAROM/PROM ?  ?A/PROM AROM  ?04/03/2021 AROM ?04/12/2021  ?Flexion 75% and painful Movement to mid shin c ERP noted   ?Extension 50% and painful 75% WFL c ERP noted.  Repeated 5x in standing improved symptoms noted   ?Right lateral flexion 50%   ?Left lateral flexion 50%   ?Right rotation 50%   ?Left rotation 50%   ? (Blank rows = not tested) ?  ?04/03/2021 Decreased overall ROM in bilat hips ?  ?LE MMT: ?  ?MMT Right ?04/03/2021 Left ?04/03/2021  ?Hip flexion 4 4+  ?Hip extension      ?Hip abduction 4 4  ?Hip adduction      ?Hip internal rotation      ?  Hip external rotation      ?Knee flexion 5 5  ?Knee extension 4+ 5  ?Ankle dorsiflexion 4+ 4+  ?Ankle plantarflexion      ?Ankle inversion      ?Ankle eversion      ? (Blank rows = not tested) ?  ?LUMBAR SPECIAL TESTS:  ?04/03/2021 Negative slump test bilat, + quadrant test bilat, + SLR on Rt that was negative on lefts ?   ?GAIT: ?04/03/2021 ?WNL ?  ?  ?TODAY'S TREATMENT  ?04/12/2021: ? Therex: Supine bridge x 5, x 10  ?   Standing lumbar extension x 5 (cues for home use) ?   Supine LTR stretch 15 sec x 3 bilateral ?   Supine SKC in hooklying 15 sec x 3 bilateral ?   Attempted prone arm/leg lifts and in quadruped but stopped upon trials due to Rt arm/shoulder pain ? ? Modalities: IFC to lumbar 10 mins in sitting, to tolerance level.  ?  ? ?04/03/2021 ?Reviewed HEP listed below ?Moist heat and IFC TENS therapy to low back X 12 min ?  ?  ?PATIENT EDUCATION:  ?3/172023 ?Education  details: HEP, Dry needling information ?Person educated: Patient ?Education method: Explanation, Demonstration, Verbal cues, and Handouts ?Education comprehension: verbalized understanding and needs further education ?  ?  ?HOME EXERCISE PROGRAM: ?04/12/2021: ?Access Code: [email or web address] ?URL: [email or web address] ?Date: 04/12/2021 ?Prepared by: Scot Jun ? ?Exercises ?Standing Posterior Pelvic Tilt - 2 x daily - 6 x weekly - 1 sets - 15-20 reps ?Standing Lumbar Spine Flexion Stretch Counter - 2 x daily - 6 x weekly - 1 sets - 5 reps - 10 hold ?Hooklying Single Knee to Chest Stretch - 2 x daily - 6 x weekly - 1 sets - 2 reps - 20 hold ?Supine Bridge - 2 x daily - 6 x weekly - 1-2 sets - 10 reps - 5 hold ?Seated Hamstring Stretch - 2 x daily - 6 x weekly - 1 sets - 2 reps - 30 hold ?Deadlift with Resistance - 2 x daily - 6 x weekly - 1-2 sets - 10 reps ?Standing Anti-Rotation Press with Anchored Resistance - 2 x daily - 6 x weekly - 1-2 sets - 10 reps ?Standing Lumbar Extension with Counter - 3-5 x daily - 7 x weekly - 1 sets - 5-10 reps ?Supine Lower Trunk Rotation - 2-3 x daily - 7 x weekly - 1 sets - 3-5 reps - 15 hold ? ?  ?Patient Education ?TENS Therapy - previous provided ?Dry Needling handout provided today ?  ?ASSESSMENT: ?  ?CLINICAL IMPRESSION: ? Pt presented c mild improvements in lumbar c progressive mobility intervention to tolerance amount.  Discussion about inclusion of dry needling into POC was met with hesitation per Pt report.  Provided Pt with educational handout and will include if desired in future plan of care.  Pt benefit from education in HEP performance to avoid worsening of pain symptoms during stretching/movement.  Attempts to progress in some intervention was limited by Rt shoulder/arm symptoms as noted.  Continued skilled PT services indicated to help reduced pain and progress towards goals.  ?  ?OBJECTIVE IMPAIRMENTS: decreased activity tolerance, difficulty walking,  decreased balance, decreased endurance, decreased mobility, decreased ROM, decreased strength, impaired flexibility, impaired LE use, postural dysfunction, and pain. ?  ?ACTIVITY LIMITATIONS: bending, lifting, carry, locomotion, cleaning, community activity, driving, and or occupation ?  ?PERSONAL FACTORS: OA, anx, dep, chronic pain, are also affecting patient's functional outcome. ?  ?  ?REHAB POTENTIAL:  Good ?  ?CLINICAL DECISION MAKING: stable/uncomplicated ?  ?EVALUATION COMPLEXITY: Low ?  ?  ?  ?GOALS: ?  ?  ?Long term PT goals (target dates for all long term goals are 6 weeks 05/15/21) ?Pt will improve lumbar and hip ROM to [facility] to improve functional mobility ?Baseline: ?Goal status: New ?Pt will improve  hip/knee strength to at least 5-/5 MMT to improve functional strength ?Baseline: ?Goal status: New ?Pt will improve FOTO to at least 59% functional to show improved function ?Baseline: ?Goal status: New ?Pt will reduce pain by overall 50% overall with usual activity ?Baseline: ?Goal status: New ?Pt will reduce pain to overall less than 2-3/10 with usual activity and work activity. ?Baseline: ?Goal status: New ?Pt will be independent and compliant with HEP ?          Goal status: New ?PLAN: ?PT FREQUENCY: 1-2 times per week  ?  ?PT DURATION: 6 weeks ?  ?PLANNED INTERVENTIONS (unless contraindicated): aquatic PT, Canalith repositioning, cryotherapy, Electrical stimulation, Iontophoresis with 4 mg/ml dexamethasome, Moist heat, traction, Ultrasound, gait training, Therapeutic exercise, balance training, neuromuscular re-education, patient/family education, prosthetic training, manual techniques, passive ROM, dry needling, taping, vasopnuematic device, vestibular, spinal manipulations, joint manipulations ?  ?PLAN FOR NEXT SESSION:  Recheck lumbar mobility/LE strength objective data.  Include aerobic intervention and hip/leg strengthening to tolerance.  ? ? ? ?Scot Jun, PT, DPT, OCS, ATC ?04/12/21  2:16  PM ? ? ? ?  ? ?

## 2021-04-14 NOTE — Progress Notes (Signed)
? ? ?Patient ID: Sheila Weber, female    DOB: [birth year]  MRN: [id number] ? ?CC: Insomnia Follow-Up ? ?Subjective: ?Sheila Weber is a 46 y.o. female who presents for insomnia follow-up.  ? ?Her concerns today include:  ?INSOMNIA FOLLOW-UP: ?03/22/2021: ?Increase Trazodone from 50 mg to 100 mg.  ? ?04/19/2021: ?Doesn't feel Trazodone is helping.  ? ?2. ACID REFLUX FOLLOW-UP: ?03/22/2021: ?- Omeprazole as prescribed. Counseled on medication compliance and adverse effects. ? ?04/19/2021: ?Doesn't feel Omeprazole is helping. Throat with burning sensation.  ? ? ?Patient Active Problem List  ? Diagnosis Date Noted  ? Low back pain 03/26/2021  ? Prediabetes 02/26/2021  ?  ? ?Current Outpatient Medications on File Prior to Visit  ?Medication Sig Dispense Refill  ? cyclobenzaprine (FLEXERIL) 10 MG tablet Take 1 tablet (10 mg total) by mouth 2 (two) times daily as needed for muscle spasms. 30 tablet 0  ? gabapentin (NEURONTIN) 300 MG capsule Take 1 capsule (300 mg total) by mouth daily. 90 capsule 0  ? levonorgestrel-ethinyl estradiol (AVIANE) 0.1-20 MG-MCG tablet Take 1 tablet by mouth daily. 28 tablet 11  ? Meloxicam 15 MG TBDP One qd for pain and inflammation 30 tablet 0  ? naproxen (NAPROSYN) 500 MG tablet Take 1 tablet (500 mg total) by mouth 2 (two) times daily with a meal. 30 tablet 0  ? omeprazole (PRILOSEC) 20 MG capsule Take 1 capsule (20 mg total) by mouth daily. 90 capsule 0  ? traZODone (DESYREL) 100 MG tablet Take 1 tablet (100 mg total) by mouth at bedtime as needed for sleep. 30 tablet 1  ? ?No current facility-administered medications on file prior to visit.  ? ? ?No Known Allergies ? ?Social History  ? ?Socioeconomic History  ? Marital status: Single  ?  Spouse name: Not on file  ? Number of children: Not on file  ? Years of education: Not on file  ? Highest education level: Not on file  ?Occupational History  ? Not on file  ?Tobacco Use  ? Smoking status: Never  ?  Passive exposure: Never  ? Smokeless  tobacco: Never  ?Vaping Use  ? Vaping Use: Never used  ?Substance and Sexual Activity  ? Alcohol use: No  ? Drug use: No  ? Sexual activity: Yes  ?  Birth control/protection: None  ?Other Topics Concern  ? Not on file  ?Social History Narrative  ? Not on file  ? ?Social Determinants of Health  ? ?Financial Resource Strain: Not on file  ?Food Insecurity: Not on file  ?Transportation Needs: Not on file  ?Physical Activity: Not on file  ?Stress: Not on file  ?Social Connections: Not on file  ?Intimate Partner Violence: Not on file  ? ? ?Family History  ?Problem Relation Age of Onset  ? Hypertension Mother   ? Asthma Mother   ? Diabetes Father   ? ? ?Past Surgical History:  ?Procedure Laterality Date  ? CESAREAN SECTION    ? ? ?ROS: ?Review of Systems ?Negative except as stated above ? ?PHYSICAL EXAM: ?BP 124/82 (BP Location: Left Arm, Patient Position: Sitting, Cuff Size: Large)   Pulse 87   Temp 98.3 ?F (36.8 ?C)   Resp 18   Ht 5' 6.73" (1.695 m)   Wt 190 lb (86.2 kg)   SpO2 97%   BMI 30.00 kg/m?  ? ?Physical Exam ?HENT:  ?   Head: Normocephalic and atraumatic.  ?Eyes:  ?   Extraocular Movements: Extraocular movements intact.  ?  Conjunctiva/sclera: Conjunctivae normal.  ?   Pupils: Pupils are equal, round, and reactive to light.  ?Cardiovascular:  ?   Rate and Rhythm: Normal rate and regular rhythm.  ?   Pulses: Normal pulses.  ?   Heart sounds: Normal heart sounds.  ?Pulmonary:  ?   Effort: Pulmonary effort is normal.  ?   Breath sounds: Normal breath sounds.  ?Musculoskeletal:  ?   Cervical back: Normal range of motion and neck supple.  ?Neurological:  ?   General: No focal deficit present.  ?   Mental Status: She is alert and oriented to person, place, and time.  ?Psychiatric:     ?   Mood and Affect: Mood is anxious.     ?   Behavior: Behavior normal.     ?   Thought Content: Thought content normal.     ?   Judgment: Judgment normal.  ? ?ASSESSMENT AND PLAN: ?1. Insomnia, unspecified type: ?- Increase  Trazodone from 100 mg at bedtime to 150 mg at bedtime. No refills needed as of present. Patient to take 1.5 tablets of current 100 mg tablets.  ?- Follow-up with primary provider in 4 weeks or sooner if needed.  ? ?2. Gastroesophageal reflux disease, unspecified whether esophagitis present: ?- Increase Omeprazole from 20 mg daily to 40 mg daily. No refills needed as of present. Patient to take 2 capsules of current 20 mg capsules.  ?- Referral to Gastroenterology for further evaluation and management.  ?- Ambulatory referral to Gastroenterology ? ? ?Patient was given the opportunity to ask questions.  Patient verbalized understanding of the plan and was able to repeat key elements of the plan. Patient was given clear instructions to go to Emergency Department or return to medical center if symptoms don't improve, worsen, or new problems develop.The patient verbalized understanding. ? ? ?Orders Placed This Encounter  ?Procedures  ? Ambulatory referral to Gastroenterology  ? ?Return in about 4 weeks (around 05/17/2021) for Telephone Visit Insomnia . ? ?Camillia Herter, NP  ?

## 2021-04-16 ENCOUNTER — Encounter: Payer: Self-pay | Admitting: Physical Therapy

## 2021-04-16 ENCOUNTER — Ambulatory Visit: Payer: 59 | Admitting: Physical Therapy

## 2021-04-16 ENCOUNTER — Other Ambulatory Visit: Payer: Self-pay

## 2021-04-16 DIAGNOSIS — R293 Abnormal posture: Secondary | ICD-10-CM | POA: Diagnosis not present

## 2021-04-16 DIAGNOSIS — M5441 Lumbago with sciatica, right side: Secondary | ICD-10-CM | POA: Diagnosis not present

## 2021-04-16 DIAGNOSIS — G8929 Other chronic pain: Secondary | ICD-10-CM

## 2021-04-16 DIAGNOSIS — M6281 Muscle weakness (generalized): Secondary | ICD-10-CM | POA: Diagnosis not present

## 2021-04-16 DIAGNOSIS — M5442 Lumbago with sciatica, left side: Secondary | ICD-10-CM

## 2021-04-16 NOTE — Therapy (Signed)
?OUTPATIENT PHYSICAL THERAPY TREATMENT NOTE ? ? ?Patient Name: Sheila Weber ?MRN: [id number] ?DOB:Jun 14, 1975, 46 y.o., female ?Today's Date: 04/16/2021 ? ? PT End of Session - 04/16/21 7793   ? ? Visit Number 3   ? Number of Visits 10   ? Date for PT Re-Evaluation 05/15/21   ? PT Start Time 0845   ? PT Stop Time 0925   ? PT Time Calculation (min) 40 min   ? Activity Tolerance Patient tolerated treatment well   ? Behavior During Therapy [facility] for tasks assessed/performed   ? ?  ?  ? ?  ? ? ?Past Medical History:  ?Diagnosis Date  ? Anxiety   ? Chronic back pain greater than 3 months duration   ? Depression   ? ?Past Surgical History:  ?Procedure Laterality Date  ? CESAREAN SECTION    ? ?Patient Active Problem List  ? Diagnosis Date Noted  ? Low back pain 03/26/2021  ? Prediabetes 02/26/2021  ? ?REFERRING PROVIDER: Garald Balding, MD ?  ?REFERRING DIAG: M54.50,G89.29 (ICD-10-CM) - Chronic midline low back pain, unspecified whether sciatica present ? ?ONSET DATE: Chronic pain more than one year ? ?THERAPY DIAG:  ?Chronic bilateral low back pain with bilateral sciatica ? ?Muscle weakness (generalized) ? ?Abnormal posture ? ?PERTINENT HISTORY: JQZ:[id number] LBP,anx,dep ? ?PRECAUTIONS: None ? ?SUBJECTIVE: Pt indicates the pain is in her low back and not down her legs today, she may be some better overall  ? ?PAIN:  ?Are you having pain? Yes ?NPRS scale: 2/10 today ?Pain location: low back  ?Pain orientation: Bilateral  ?PAIN TYPE: aching, sharp, and tingling ?Pain description: constant pain in back and intermittent radicular pain ?Aggravating factors: standing at work, bending ?Relieving factors: sit with a pillow, meds, heat ? ? ? ? ? ?OBJECTIVE:  ?  ?DIAGNOSTIC FINDINGS:  ?04/03/2021  "Films lumbar spine in 2 projections and compared to films from 2019.  There is minimal facet sclerosis at L4-5 and L5-S1.  No scoliosis or listhesis.  Disc spaces are well-maintained.  No evidence of compression fracture" ?  ?PATIENT  SURVEYS:  ?04/03/2021 FOTO 53% functional ? ?MUSCLE LENGTH: ?04/03/2021 ?Hamstrings: Right 50 deg; Left 75 deg ?  ?  ?POSTURE:  ?04/03/2021 ?Anterior pelvic tilt ?  ?PALPATION: ?04/03/2021 ?TTP L4-5 ?Increased muscle tension in lumbar paraspinals ?  ?LUMBARAROM/PROM ?  ?A/PROM AROM  ?04/03/2021 AROM ?04/12/2021  ?Flexion 75% and painful Movement to mid shin c ERP noted   ?Extension 50% and painful 75% WFL c ERP noted.  Repeated 5x in standing improved symptoms noted   ?Right lateral flexion 50%   ?Left lateral flexion 50%   ?Right rotation 50%   ?Left rotation 50%   ? (Blank rows = not tested) ?  ?04/03/2021 Decreased overall ROM in bilat hips ?  ?LE MMT: ?  ?MMT Right ?04/03/2021 Left ?04/03/2021  ?Hip flexion 4 4+  ?Hip extension      ?Hip abduction 4 4  ?Hip adduction      ?Hip internal rotation      ?Hip external rotation      ?Knee flexion 5 5  ?Knee extension 4+ 5  ?Ankle dorsiflexion 4+ 4+  ?Ankle plantarflexion      ?Ankle inversion      ?Ankle eversion      ? (Blank rows = not tested) ?  ?LUMBAR SPECIAL TESTS:  ?04/03/2021 Negative slump test bilat, + quadrant test bilat, + SLR on Rt that was negative on lefts ?   ?GAIT: ?04/03/2021 ?  WNL ?  ?  ?TODAY'S TREATMENT  ?04/16/2021: ? Therex:  ?   Recumbent bike L3 X 6 min ?   Leg press DL 75# 3X10 ?   Standing bilat shoulder rows and extensions Green X 20 ea ?   Standing lumbar extension 5 sec x 10 ?Supine bridge 5 sec, x 15  ?   Supine LTR stretch 5 sec x 10 bilateral ?   Supine SKC in hooklying 15 sec x 3 bilateral ?    ? Modalities: IFC to lumbar 10 mins in sitting, to tolerance level with heat to low back  ? ?04/12/2021: ? Therex: Supine bridge x 5, x 10  ?   Standing lumbar extension x 5 (cues for home use) ?   Supine LTR stretch 15 sec x 3 bilateral ?   Supine SKC in hooklying 15 sec x 3 bilateral ?   Attempted prone arm/leg lifts and in quadruped but stopped upon trials due to Rt arm/shoulder pain ? ? Modalities: IFC to lumbar 10 mins in sitting, to tolerance level.  ?  ?   ?PATIENT EDUCATION:  ?3/172023 ?Education details: HEP, Dry needling information ?Person educated: Patient ?Education method: Explanation, Demonstration, Verbal cues, and Handouts ?Education comprehension: verbalized understanding and needs further education ?  ?  ?HOME EXERCISE PROGRAM: ?04/12/2021: ?Access Code: [email or web address] ?URL: [email or web address] ?Date: 04/12/2021 ?Prepared by: Scot Jun ? ?Exercises ?Standing Posterior Pelvic Tilt - 2 x daily - 6 x weekly - 1 sets - 15-20 reps ?Standing Lumbar Spine Flexion Stretch Counter - 2 x daily - 6 x weekly - 1 sets - 5 reps - 10 hold ?Hooklying Single Knee to Chest Stretch - 2 x daily - 6 x weekly - 1 sets - 2 reps - 20 hold ?Supine Bridge - 2 x daily - 6 x weekly - 1-2 sets - 10 reps - 5 hold ?Seated Hamstring Stretch - 2 x daily - 6 x weekly - 1 sets - 2 reps - 30 hold ?Deadlift with Resistance - 2 x daily - 6 x weekly - 1-2 sets - 10 reps ?Standing Anti-Rotation Press with Anchored Resistance - 2 x daily - 6 x weekly - 1-2 sets - 10 reps ?Standing Lumbar Extension with Counter - 3-5 x daily - 7 x weekly - 1 sets - 5-10 reps ?Supine Lower Trunk Rotation - 2-3 x daily - 7 x weekly - 1 sets - 3-5 reps - 15 hold ? ?  ?Patient Education ?TENS Therapy - previous provided ?Dry Needling handout provided today ?  ?ASSESSMENT: ?  ?CLINICAL IMPRESSION: ?She appears to be having overall less radicular symptoms in her legs and pain was more centralized to low back today. She had good overall tolerance to strength progressions today.  Continued skilled PT services indicated to help reduced pain and progress towards goals.  ?  ?OBJECTIVE IMPAIRMENTS: decreased activity tolerance, difficulty walking, decreased balance, decreased endurance, decreased mobility, decreased ROM, decreased strength, impaired flexibility, impaired LE use, postural dysfunction, and pain. ?  ?ACTIVITY LIMITATIONS: bending, lifting, carry, locomotion, cleaning, community activity, driving,  and or occupation ?  ?PERSONAL FACTORS: OA, anx, dep, chronic pain, are also affecting patient's functional outcome. ?  ?  ?REHAB POTENTIAL: Good ?  ?CLINICAL DECISION MAKING: stable/uncomplicated ?  ?EVALUATION COMPLEXITY: Low ?  ?  ?  ?GOALS: ?  ?  ?Long term PT goals (target dates for all long term goals are 6 weeks 05/15/21) ?Pt will improve lumbar and hip ROM to [facility] to improve functional  mobility ?Baseline: ?Goal status: New ?Pt will improve  hip/knee strength to at least 5-/5 MMT to improve functional strength ?Baseline: ?Goal status: New ?Pt will improve FOTO to at least 59% functional to show improved function ?Baseline: ?Goal status: New ?Pt will reduce pain by overall 50% overall with usual activity ?Baseline: ?Goal status: New ?Pt will reduce pain to overall less than 2-3/10 with usual activity and work activity. ?Baseline: ?Goal status: New ?Pt will be independent and compliant with HEP ?          Goal status: New ?PLAN: ?PT FREQUENCY: 1-2 times per week  ?  ?PT DURATION: 6 weeks ?  ?PLANNED INTERVENTIONS (unless contraindicated): aquatic PT, Canalith repositioning, cryotherapy, Electrical stimulation, Iontophoresis with 4 mg/ml dexamethasome, Moist heat, traction, Ultrasound, gait training, Therapeutic exercise, balance training, neuromuscular re-education, patient/family education, prosthetic training, manual techniques, passive ROM, dry needling, taping, vasopnuematic device, vestibular, spinal manipulations, joint manipulations ?  ?PLAN FOR NEXT SESSION:  Recheck lumbar mobility/LE strength objective data.  Progress lumar/core/hip/leg strengthening to tolerance.  ? ? ? ?Elsie Ra, PT, DPT ?04/16/21 8:53 AM ? ? ? ? ?  ? ?

## 2021-04-19 ENCOUNTER — Ambulatory Visit (INDEPENDENT_AMBULATORY_CARE_PROVIDER_SITE_OTHER): Payer: 59 | Admitting: Family

## 2021-04-19 ENCOUNTER — Encounter: Payer: Self-pay | Admitting: Family

## 2021-04-19 ENCOUNTER — Other Ambulatory Visit: Payer: Self-pay

## 2021-04-19 VITALS — BP 124/82 | HR 87 | Temp 98.3°F | Resp 18 | Ht 66.73 in | Wt 190.0 lb

## 2021-04-19 DIAGNOSIS — K219 Gastro-esophageal reflux disease without esophagitis: Secondary | ICD-10-CM | POA: Diagnosis not present

## 2021-04-19 DIAGNOSIS — G4700 Insomnia, unspecified: Secondary | ICD-10-CM

## 2021-04-19 NOTE — Progress Notes (Signed)
Pt presents for insomnia and acid rflux follow-up, states don't feels meds prescribed working for either issue ?

## 2021-04-22 ENCOUNTER — Other Ambulatory Visit: Payer: 59

## 2021-04-23 ENCOUNTER — Other Ambulatory Visit: Payer: Self-pay

## 2021-04-23 ENCOUNTER — Encounter: Payer: Self-pay | Admitting: Physical Therapy

## 2021-04-23 ENCOUNTER — Ambulatory Visit (INDEPENDENT_AMBULATORY_CARE_PROVIDER_SITE_OTHER): Payer: 59 | Admitting: Physical Therapy

## 2021-04-23 DIAGNOSIS — M6281 Muscle weakness (generalized): Secondary | ICD-10-CM | POA: Diagnosis not present

## 2021-04-23 DIAGNOSIS — G8929 Other chronic pain: Secondary | ICD-10-CM

## 2021-04-23 DIAGNOSIS — M5441 Lumbago with sciatica, right side: Secondary | ICD-10-CM | POA: Diagnosis not present

## 2021-04-23 DIAGNOSIS — M5442 Lumbago with sciatica, left side: Secondary | ICD-10-CM

## 2021-04-23 DIAGNOSIS — R293 Abnormal posture: Secondary | ICD-10-CM | POA: Diagnosis not present

## 2021-04-23 NOTE — Therapy (Addendum)
?OUTPATIENT PHYSICAL THERAPY TREATMENT NOTE ? ? ?Patient Name: Sheila Weber ?MRN: [id number] ?DOB:[birth year], 46 y.o., female ?Today's Date: 04/23/2021 ? ? PT End of Session - 04/23/21 0959   ? ? Visit Number 4   ? Number of Visits 10   ? Date for PT Re-Evaluation 05/15/21   ? PT Start Time [phone]   ? PT Stop Time 1015   ? PT Time Calculation (min) 39 min   ? Activity Tolerance Patient tolerated treatment well   ? Behavior During Therapy [facility] for tasks assessed/performed   ? ?  ?  ? ?  ? ? ? ?Past Medical History:  ?Diagnosis Date  ? Anxiety   ? Chronic back pain greater than 3 months duration   ? Depression   ? ?Past Surgical History:  ?Procedure Laterality Date  ? CESAREAN SECTION    ? ?Patient Active Problem List  ? Diagnosis Date Noted  ? Low back pain 03/26/2021  ? Prediabetes 02/26/2021  ? ?REFERRING PROVIDER: Garald Balding, MD ?  ?REFERRING DIAG: M54.50,G89.29 (ICD-10-CM) - Chronic midline low back pain, unspecified whether sciatica present ? ?ONSET DATE: Chronic pain more than one year ? ?THERAPY DIAG:  ?Chronic bilateral low back pain with bilateral sciatica ? ?Muscle weakness (generalized) ? ?Abnormal posture ? ?PERTINENT HISTORY: FTD:DUKGURK LBP,anx,dep ? ?PRECAUTIONS: None ? ?SUBJECTIVE: Pt indicates the pain is in her low back and not down her legs today but had this bad over the weekend.  ? ?PAIN:  ?Are you having pain? Yes ?NPRS scale: 4/10 today ?Pain location: low back  ?Pain orientation: Bilateral  ?PAIN TYPE: aching, sharp, and tingling ?Pain description: constant pain in back and intermittent radicular pain ?Aggravating factors: standing at work, bending ?Relieving factors: sit with a pillow, meds, heat ? ? ? ? ? ?OBJECTIVE:  ?  ?DIAGNOSTIC FINDINGS:  ?04/03/2021  "Films lumbar spine in 2 projections and compared to films from 2019.  There is minimal facet sclerosis at L4-5 and L5-S1.  No scoliosis or listhesis.  Disc spaces are well-maintained.  No evidence of compression fracture" ?  ?PATIENT  SURVEYS:  ?04/03/2021 FOTO 53% functional ? ?MUSCLE LENGTH: ?04/03/2021 ?Hamstrings: Right 50 deg; Left 75 deg ?  ?  ?POSTURE:  ?04/03/2021 ?Anterior pelvic tilt ?  ?PALPATION: ?04/03/2021 ?TTP L4-5 ?Increased muscle tension in lumbar paraspinals ?  ?LUMBARAROM/PROM ?  ?A/PROM AROM  ?04/03/2021 AROM ?04/12/2021  ?Flexion 75% and painful Movement to mid shin c ERP noted   ?Extension 50% and painful 75% WFL c ERP noted.  Repeated 5x in standing improved symptoms noted   ?Right lateral flexion 50%   ?Left lateral flexion 50%   ?Right rotation 50%   ?Left rotation 50%   ? (Blank rows = not tested) ?  ?04/03/2021 Decreased overall ROM in bilat hips ?  ?LE MMT: ?  ?MMT Right ?04/03/2021 Left ?04/03/2021  ?Hip flexion 4 4+  ?Hip extension      ?Hip abduction 4 4  ?Hip adduction      ?Hip internal rotation      ?Hip external rotation      ?Knee flexion 5 5  ?Knee extension 4+ 5  ?Ankle dorsiflexion 4+ 4+  ?Ankle plantarflexion      ?Ankle inversion      ?Ankle eversion      ? (Blank rows = not tested) ?  ?LUMBAR SPECIAL TESTS:  ?04/03/2021 Negative slump test bilat, + quadrant test bilat, + SLR on Rt that was negative on lefts ?   ?  GAIT: ?04/03/2021 ?WNL ?  ?  ?TODAY'S TREATMENT  ?04/23/2021: ?Therex:  ? Recumbent bike L3 X 6 min with heat to low back ? Leg press DL 81# 3X10 ? Standing lumbar extension 5 sec x 10 ?Supine bridge 5 sec, x 15  ? Supine LTR stretch 10 sec x 5 bilateral ?    ?Modalities: Lumbar mechanical traction 90-75# intermittent X 15 min  ?  ?04/16/2021: ? Therex:  ?   Recumbent bike L3 X 6 min ?   Leg press DL 75# 3X10 ?   Standing bilat shoulder rows and extensions Green X 20 ea ?   Standing lumbar extension 5 sec x 10 ?Supine bridge 5 sec, x 15  ?   Supine LTR stretch 5 sec x 10 bilateral ?   Supine SKC in hooklying 15 sec x 3 bilateral ?    ? Modalities: IFC to lumbar 10 mins in sitting, to tolerance level with heat to low back  ?  ?PATIENT EDUCATION:  ?3/172023 ?Education details: HEP, Dry needling information ?Person  educated: Patient ?Education method: Explanation, Demonstration, Verbal cues, and Handouts ?Education comprehension: verbalized understanding and needs further education ?  ?  ?HOME EXERCISE PROGRAM: ?04/12/2021: ?Access Code: [email or web address] ?URL: [email or web address] ?Date: 04/12/2021 ?Prepared by: Scot Jun ? ?Exercises ?Standing Posterior Pelvic Tilt - 2 x daily - 6 x weekly - 1 sets - 15-20 reps ?Standing Lumbar Spine Flexion Stretch Counter - 2 x daily - 6 x weekly - 1 sets - 5 reps - 10 hold ?Hooklying Single Knee to Chest Stretch - 2 x daily - 6 x weekly - 1 sets - 2 reps - 20 hold ?Supine Bridge - 2 x daily - 6 x weekly - 1-2 sets - 10 reps - 5 hold ?Seated Hamstring Stretch - 2 x daily - 6 x weekly - 1 sets - 2 reps - 30 hold ?Deadlift with Resistance - 2 x daily - 6 x weekly - 1-2 sets - 10 reps ?Standing Anti-Rotation Press with Anchored Resistance - 2 x daily - 6 x weekly - 1-2 sets - 10 reps ?Standing Lumbar Extension with Counter - 3-5 x daily - 7 x weekly - 1 sets - 5-10 reps ?Supine Lower Trunk Rotation - 2-3 x daily - 7 x weekly - 1 sets - 3-5 reps - 15 hold ? ?  ?Patient Education ?TENS Therapy - previous provided ?Dry Needling handout provided today ?  ?ASSESSMENT: ?  ?CLINICAL IMPRESSION: ?She is still having some intermittent lumbar radiculopathy, she says this was bad over the weekend but is better today. I did try mechanical lumbar traction with her to see if this relieves any radicular symptoms.  ?  ?OBJECTIVE IMPAIRMENTS: decreased activity tolerance, difficulty walking, decreased balance, decreased endurance, decreased mobility, decreased ROM, decreased strength, impaired flexibility, impaired LE use, postural dysfunction, and pain. ?  ?ACTIVITY LIMITATIONS: bending, lifting, carry, locomotion, cleaning, community activity, driving, and or occupation ?  ?PERSONAL FACTORS: OA, anx, dep, chronic pain, are also affecting patient's functional outcome. ?  ?  ?REHAB POTENTIAL: Good ?   ?CLINICAL DECISION MAKING: stable/uncomplicated ?  ?EVALUATION COMPLEXITY: Low ?  ?  ?  ?GOALS: ?  ?  ?Long term PT goals (target dates for all long term goals are 6 weeks 05/15/21) ?Pt will improve lumbar and hip ROM to [facility] to improve functional mobility ?Baseline: ?Goal status: New ?Pt will improve  hip/knee strength to at least 5-/5 MMT to improve functional strength ?Baseline: ?Goal status: New ?Pt will  improve FOTO to at least 59% functional to show improved function ?Baseline: ?Goal status: New ?Pt will reduce pain by overall 50% overall with usual activity ?Baseline: ?Goal status: New ?Pt will reduce pain to overall less than 2-3/10 with usual activity and work activity. ?Baseline: ?Goal status: New ?Pt will be independent and compliant with HEP ?          Goal status: New ?PLAN: ?PT FREQUENCY: 1-2 times per week  ?  ?PT DURATION: 6 weeks ?  ?PLANNED INTERVENTIONS (unless contraindicated): aquatic PT, Canalith repositioning, cryotherapy, Electrical stimulation, Iontophoresis with 4 mg/ml dexamethasome, Moist heat, traction, Ultrasound, gait training, Therapeutic exercise, balance training, neuromuscular re-education, patient/family education, prosthetic training, manual techniques, passive ROM, dry needling, taping, vasopnuematic device, vestibular, spinal manipulations, joint manipulations ?  ?PLAN FOR NEXT SESSION:  How was mechanical traction?  Progress lumar/core/hip/leg strengthening to tolerance.  ? ? ? ?Elsie Ra, PT, DPT ?04/23/21 10:06 AM ? ? ? ? ?  ? ?

## 2021-04-29 ENCOUNTER — Other Ambulatory Visit: Payer: Self-pay | Admitting: Family

## 2021-04-29 DIAGNOSIS — G5601 Carpal tunnel syndrome, right upper limb: Secondary | ICD-10-CM

## 2021-04-29 DIAGNOSIS — G4700 Insomnia, unspecified: Secondary | ICD-10-CM

## 2021-04-30 ENCOUNTER — Ambulatory Visit (INDEPENDENT_AMBULATORY_CARE_PROVIDER_SITE_OTHER): Payer: 59 | Admitting: Physical Therapy

## 2021-04-30 ENCOUNTER — Encounter: Payer: Self-pay | Admitting: Physical Therapy

## 2021-04-30 DIAGNOSIS — M5441 Lumbago with sciatica, right side: Secondary | ICD-10-CM | POA: Diagnosis not present

## 2021-04-30 DIAGNOSIS — G8929 Other chronic pain: Secondary | ICD-10-CM

## 2021-04-30 DIAGNOSIS — M5442 Lumbago with sciatica, left side: Secondary | ICD-10-CM

## 2021-04-30 DIAGNOSIS — M6281 Muscle weakness (generalized): Secondary | ICD-10-CM

## 2021-04-30 DIAGNOSIS — R293 Abnormal posture: Secondary | ICD-10-CM | POA: Diagnosis not present

## 2021-04-30 NOTE — Therapy (Signed)
?OUTPATIENT PHYSICAL THERAPY TREATMENT NOTE ? ? ?Patient Name: Sheila Weber ?MRN: [id number] ?DOB:[birth year], 46 y.o., female ?Today's Date: 04/30/2021 ? ? PT End of Session - 04/30/21 1503   ? ? Visit Number 5   ? Number of Visits 10   ? Date for PT Re-Evaluation 05/15/21   ? PT Start Time 1440   ? PT Stop Time 1520   ? PT Time Calculation (min) 40 min   ? Activity Tolerance Patient tolerated treatment well   ? Behavior During Therapy [facility] for tasks assessed/performed   ? ?  ?  ? ?  ? ? ? ? ?Past Medical History:  ?Diagnosis Date  ? Anxiety   ? Chronic back pain greater than 3 months duration   ? Depression   ? ?Past Surgical History:  ?Procedure Laterality Date  ? CESAREAN SECTION    ? ?Patient Active Problem List  ? Diagnosis Date Noted  ? Low back pain 03/26/2021  ? Prediabetes 02/26/2021  ? ?REFERRING PROVIDER: Garald Balding, MD ?  ?REFERRING DIAG: M54.50,G89.29 (ICD-10-CM) - Chronic midline low back pain, unspecified whether sciatica present ? ?ONSET DATE: Chronic pain more than one year ? ?THERAPY DIAG:  ?Chronic bilateral low back pain with bilateral sciatica ? ?Muscle weakness (generalized) ? ?Abnormal posture ? ?PERTINENT HISTORY: GEX:[id number] LBP,anx,dep ? ?PRECAUTIONS: None ? ?SUBJECTIVE: Pt arriving today with 4/10 pain in her right sided low back  ? ?PAIN:  ?Are you having pain? Yes ?NPRS scale: 4/10 today ?Pain location: low back  ?Pain orientation: Bilateral  ?PAIN TYPE: aching, sharp, and tingling ?Pain description: constant pain in back and intermittent radicular pain ?Aggravating factors: standing at work, bending ?Relieving factors: sit with a pillow, meds, heat ? ? ? ? ? ?OBJECTIVE:  ?  ?DIAGNOSTIC FINDINGS:  ?04/03/2021  "Films lumbar spine in 2 projections and compared to films from 2019.  There is minimal facet sclerosis at L4-5 and L5-S1.  No scoliosis or listhesis.  Disc spaces are well-maintained.  No evidence of compression fracture" ?  ?PATIENT SURVEYS:  ?04/03/2021 FOTO 53%  functional ? ?MUSCLE LENGTH: ?04/03/2021 ?Hamstrings: Right 50 deg; Left 75 deg ?  ?  ?POSTURE:  ?04/03/2021 ?Anterior pelvic tilt ?  ?PALPATION: ?04/03/2021 ?TTP L4-5 ?Increased muscle tension in lumbar paraspinals ?  ?LUMBARAROM/PROM ?  ?A/PROM AROM  ?04/03/2021 AROM ?04/12/2021  ?Flexion 75% and painful Movement to mid shin c ERP noted   ?Extension 50% and painful 75% WFL c ERP noted.  Repeated 5x in standing improved symptoms noted   ?Right lateral flexion 50%   ?Left lateral flexion 50%   ?Right rotation 50%   ?Left rotation 50%   ? (Blank rows = not tested) ?  ?04/03/2021 Decreased overall ROM in bilat hips ?  ?LE MMT: ?  ?MMT Right ?04/03/2021 Left ?04/03/2021  ?Hip flexion 4 4+  ?Hip extension      ?Hip abduction 4 4  ?Hip adduction      ?Hip internal rotation      ?Hip external rotation      ?Knee flexion 5 5  ?Knee extension 4+ 5  ?Ankle dorsiflexion 4+ 4+  ?Ankle plantarflexion      ?Ankle inversion      ?Ankle eversion      ? (Blank rows = not tested) ?  ?LUMBAR SPECIAL TESTS:  ?04/03/2021 Negative slump test bilat, + quadrant test bilat, + SLR on Rt that was negative on lefts ?   ?GAIT: ?04/03/2021 ?WNL ?  ?  ?TODAY'S TREATMENT  ?  04/30/2021: ?Therex:  ? Nustep L5 X 6 min  ? Leg press DL 81# 3X10 ? Standing lumbar extension 5 sec x 10 ?Supine bridge 5 sec, x 15  ? Supine trunk rotation: x 2 each side holding 30 seconds ? Standing trunk rotation c bar behind back under elbows (golfer stretch) ?    ?Modalities: Lumbar mechanical traction 90-75# intermittent X 20 min  ?  ? ?04/23/2021: ?Therex:  ? Recumbent bike L3 X 6 min with heat to low back ? Leg press DL 81# 3X10 ? Standing lumbar extension 5 sec x 10 ?Supine bridge 5 sec, x 15  ? Supine LTR stretch 10 sec x 5 bilateral ?    ?Modalities: Lumbar mechanical traction 90-75# intermittent X 15 min  ?  ?04/16/2021: ? Therex:  ?   Recumbent bike L3 X 6 min ?   Leg press DL 75# 3X10 ?   Standing bilat shoulder rows and extensions Green X 20 ea ?   Standing lumbar extension 5 sec x  10 ?Supine bridge 5 sec, x 15  ?   Supine LTR stretch 5 sec x 10 bilateral ?   Supine SKC in hooklying 15 sec x 3 bilateral ?    ? Modalities: IFC to lumbar 10 mins in sitting, to tolerance level with heat to low back  ?  ?PATIENT EDUCATION:  ?3/172023 ?Education details: HEP, Dry needling information ?Person educated: Patient ?Education method: Explanation, Demonstration, Verbal cues, and Handouts ?Education comprehension: verbalized understanding and needs further education ?  ?  ?HOME EXERCISE PROGRAM: ?04/12/2021: ?Access Code: [email or web address] ?URL: [email or web address] ?Date: 04/12/2021 ?Prepared by: Scot Jun ? ?Exercises ?Standing Posterior Pelvic Tilt - 2 x daily - 6 x weekly - 1 sets - 15-20 reps ?Standing Lumbar Spine Flexion Stretch Counter - 2 x daily - 6 x weekly - 1 sets - 5 reps - 10 hold ?Hooklying Single Knee to Chest Stretch - 2 x daily - 6 x weekly - 1 sets - 2 reps - 20 hold ?Supine Bridge - 2 x daily - 6 x weekly - 1-2 sets - 10 reps - 5 hold ?Seated Hamstring Stretch - 2 x daily - 6 x weekly - 1 sets - 2 reps - 30 hold ?Deadlift with Resistance - 2 x daily - 6 x weekly - 1-2 sets - 10 reps ?Standing Anti-Rotation Press with Anchored Resistance - 2 x daily - 6 x weekly - 1-2 sets - 10 reps ?Standing Lumbar Extension with Counter - 3-5 x daily - 7 x weekly - 1 sets - 5-10 reps ?Supine Lower Trunk Rotation - 2-3 x daily - 7 x weekly - 1 sets - 3-5 reps - 15 hold ? ?  ?Patient Education ?TENS Therapy - previous provided ?Dry Needling handout provided today ?  ?ASSESSMENT: ?  ?CLINICAL IMPRESSION: ?04/30/2021:  ?Pt stating she had mild soreness following mechanical traction at her last visit. Pt wishing to try again with good tolerance during session. Pt able to perform all exercises with no reports of increased pain. Continue skilled PT to maximize function.  ? ? ? ?  ?OBJECTIVE IMPAIRMENTS: decreased activity tolerance, difficulty walking, decreased balance, decreased endurance,  decreased mobility, decreased ROM, decreased strength, impaired flexibility, impaired LE use, postural dysfunction, and pain. ?  ?ACTIVITY LIMITATIONS: bending, lifting, carry, locomotion, cleaning, community activity, driving, and or occupation ?  ?PERSONAL FACTORS: OA, anx, dep, chronic pain, are also affecting patient's functional outcome. ?  ?  ?REHAB POTENTIAL: Good ?  ?CLINICAL DECISION  MAKING: stable/uncomplicated ?  ?EVALUATION COMPLEXITY: Low ?  ?  ?  ?GOALS: ?  ?  ?Long term PT goals (target dates for all long term goals are 6 weeks 05/15/21) ?Pt will improve lumbar and hip ROM to [facility] to improve functional mobility ?Baseline: ?Goal status: On-going 04/30/2021 ?Pt will improve  hip/knee strength to at least 5-/5 MMT to improve functional strength ?Baseline: ?Goal status: On-going 04/30/2021 ?Pt will improve FOTO to at least 59% functional to show improved function ?Baseline: ?Goal status: New ?Pt will reduce pain by overall 50% overall with usual activity ?Baseline: ?Goal status: On-going 04/30/2021 ?Pt will reduce pain to overall less than 2-3/10 with usual activity and work activity. ?Baseline: ?Goal status: On-going 04/30/2021 ?Pt will be independent and compliant with HEP ?          Goal status: New ? ? ?PLAN: ?PT FREQUENCY: 1-2 times per week  ?  ?PT DURATION: 6 weeks ?  ?PLANNED INTERVENTIONS (unless contraindicated): aquatic PT, Canalith repositioning, cryotherapy, Electrical stimulation, Iontophoresis with 4 mg/ml dexamethasome, Moist heat, traction, Ultrasound, gait training, Therapeutic exercise, balance training, neuromuscular re-education, patient/family education, prosthetic training, manual techniques, passive ROM, dry needling, taping, vasopnuematic device, vestibular, spinal manipulations, joint manipulations ?  ?PLAN FOR NEXT SESSION:  assess mechanical traction response?  Progress lumar/core/hip/leg strengthening to tolerance.  ? ? ? ?Kearney Hard, PT, MPT ?04/30/21 3:05 PM ? ? ?04/30/21 3:05  PM ? ? ? ? ?  ? ?

## 2021-05-07 ENCOUNTER — Ambulatory Visit (INDEPENDENT_AMBULATORY_CARE_PROVIDER_SITE_OTHER): Payer: 59 | Admitting: Physical Therapy

## 2021-05-07 ENCOUNTER — Encounter: Payer: Self-pay | Admitting: Physical Therapy

## 2021-05-07 DIAGNOSIS — R293 Abnormal posture: Secondary | ICD-10-CM | POA: Diagnosis not present

## 2021-05-07 DIAGNOSIS — G8929 Other chronic pain: Secondary | ICD-10-CM

## 2021-05-07 DIAGNOSIS — M6281 Muscle weakness (generalized): Secondary | ICD-10-CM

## 2021-05-07 DIAGNOSIS — M5441 Lumbago with sciatica, right side: Secondary | ICD-10-CM | POA: Diagnosis not present

## 2021-05-07 DIAGNOSIS — M5442 Lumbago with sciatica, left side: Secondary | ICD-10-CM

## 2021-05-07 NOTE — Therapy (Addendum)
OUTPATIENT PHYSICAL THERAPY TREATMENT NOTE/Discharge PHYSICAL THERAPY DISCHARGE SUMMARY  Visits from Start of Care: 6  Current functional level related to goals / functional outcomes: See below   Remaining deficits: See below   Education / Equipment: HEP  Plan: goals were partially met. Patient is being discharged due to not returning since last visit.  Elsie Ra, PT, DPT 07/03/21 1:46 PM      Patient Name: Sheila Weber MRN: [id number] DOB:31-Dec-1975, 46 y.o., female Today's Date: 05/07/2021   PT End of Session - 05/07/21 1531     Visit Number 6    Number of Visits 10    Date for PT Re-Evaluation 05/15/21    PT Start Time 4166    PT Stop Time 1600    PT Time Calculation (min) 44 min    Activity Tolerance Patient tolerated treatment well    Behavior During Therapy [facility] for tasks assessed/performed               Past Medical History:  Diagnosis Date   Anxiety    Chronic back pain greater than 3 months duration    Depression    Past Surgical History:  Procedure Laterality Date   CESAREAN SECTION     Patient Active Problem List   Diagnosis Date Noted   Low back pain 03/26/2021   Prediabetes 02/26/2021   REFERRING PROVIDER: Garald Balding, MD   REFERRING DIAG: M54.50,G89.29 (ICD-10-CM) - Chronic midline low back pain, unspecified whether sciatica present  ONSET DATE: Chronic pain more than one year  THERAPY DIAG:  Chronic bilateral low back pain with bilateral sciatica  Muscle weakness (generalized)  Abnormal posture  PERTINENT HISTORY: AYT:KZSWFUX LBP,anx,dep  PRECAUTIONS: None  SUBJECTIVE: Pt arriving today with 2/10 pain in her right sided low back   PAIN:  NPRS scale: 2/10 today Pain location: low back  Pain orientation: Bilateral  PAIN TYPE: dull Pain description: constant pain in back and intermittent radicular pain Aggravating factors: standing at work, bending Relieving factors: sit with a pillow, meds,  heat      OBJECTIVE:    DIAGNOSTIC FINDINGS:  04/03/2021  "Films lumbar spine in 2 projections and compared to films from 2019.  There is minimal facet sclerosis at L4-5 and L5-S1.  No scoliosis or listhesis.  Disc spaces are well-maintained.  No evidence of compression fracture"   PATIENT SURVEYS:  04/03/2021 FOTO 53% functional  MUSCLE LENGTH: 04/03/2021 Hamstrings: Right 50 deg; Left 75 deg     POSTURE:  04/03/2021 Anterior pelvic tilt   PALPATION: 04/03/2021 TTP L4-5 Increased muscle tension in lumbar paraspinals   LUMBARAROM/PROM   A/PROM AROM  04/03/2021 AROM 04/12/2021  Flexion 75% and painful Movement to mid shin c ERP noted   Extension 50% and painful 75% WFL c ERP noted.  Repeated 5x in standing improved symptoms noted   Right lateral flexion 50%   Left lateral flexion 50%   Right rotation 50%   Left rotation 50%    (Blank rows = not tested)   04/03/2021 Decreased overall ROM in bilat hips   LE MMT:   MMT Right 04/03/2021 Left 04/03/2021  Hip flexion 4 4+  Hip extension      Hip abduction 4 4  Hip adduction      Hip internal rotation      Hip external rotation      Knee flexion 5 5  Knee extension 4+ 5  Ankle dorsiflexion 4+ 4+  Ankle plantarflexion      Ankle  inversion      Ankle eversion       (Blank rows = not tested)   LUMBAR SPECIAL TESTS:  04/03/2021 Negative slump test bilat, + quadrant test bilat, + SLR on Rt that was negative on lefts    GAIT: 04/03/2021 WNL     TODAY'S TREATMENT  05/07/2021: Therex:   Bike L5 X 6 min   Leg press DL 93# 3X10  Standing lumbar extension 5 sec x 10 Supine bridge 5 sec, x 10  Standing bilat shoulder rows and extensions green X20 each     Modalities: Lumbar mechanical traction 6-55# intermittent X 20 min (she requested less pull on traction today)  04/30/2021: Therex:   Nustep L5 X 6 min   Leg press DL 81# 3X10  Standing lumbar extension 5 sec x 10 Supine bridge 5 sec, x 15   Supine trunk rotation: x 2 each  side holding 30 seconds  Standing trunk rotation c bar behind back under elbows (golfer stretch)     Modalities: Lumbar mechanical traction 90-75# intermittent X 20 min     04/23/2021: Therex:   Recumbent bike L3 X 6 min with heat to low back  Leg press DL 81# 3X10  Standing lumbar extension 5 sec x 10 Supine bridge 5 sec, x 15   Supine LTR stretch 10 sec x 5 bilateral     Modalities: Lumbar mechanical traction 90-75# intermittent X 15 min    04/16/2021:  Therex:     Recumbent bike L3 X 6 min    Leg press DL 75# 3X10    Standing bilat shoulder rows and extensions Green X 20 ea    Standing lumbar extension 5 sec x 10 Supine bridge 5 sec, x 15     Supine LTR stretch 5 sec x 10 bilateral    Supine SKC in hooklying 15 sec x 3 bilateral      Modalities: IFC to lumbar 10 mins in sitting, to tolerance level with heat to low back    PATIENT EDUCATION:  3/172023 Education details: HEP, Dry needling information Person educated: Patient Education method: Explanation, Demonstration, Verbal cues, and Handouts Education comprehension: verbalized understanding and needs further education     HOME EXERCISE PROGRAM: 04/12/2021: Access Code: [place] URL: [email or web address] Date: 04/12/2021 Prepared by: Scot Jun  Exercises Standing Posterior Pelvic Tilt - 2 x daily - 6 x weekly - 1 sets - 15-20 reps Standing Lumbar Spine Flexion Stretch Counter - 2 x daily - 6 x weekly - 1 sets - 5 reps - 10 hold Hooklying Single Knee to Chest Stretch - 2 x daily - 6 x weekly - 1 sets - 2 reps - 20 hold Supine Bridge - 2 x daily - 6 x weekly - 1-2 sets - 10 reps - 5 hold Seated Hamstring Stretch - 2 x daily - 6 x weekly - 1 sets - 2 reps - 30 hold Deadlift with Resistance - 2 x daily - 6 x weekly - 1-2 sets - 10 reps Standing Anti-Rotation Press with Anchored Resistance - 2 x daily - 6 x weekly - 1-2 sets - 10 reps Standing Lumbar Extension with Counter - 3-5 x daily - 7 x weekly  - 1 sets - 5-10 reps Supine Lower Trunk Rotation - 2-3 x daily - 7 x weekly - 1 sets - 3-5 reps - 15 hold    Patient Education TENS Therapy - previous provided Dry Needling handout provided today   ASSESSMENT:   CLINICAL  IMPRESSION: 04/30/2021:  She had less overall pain this week and appears to be favoring mechanical lumbar traction. We did progress her strength program today with good overall tolerance. She will need recert vs DC next visit  OBJECTIVE IMPAIRMENTS: decreased activity tolerance, difficulty walking, decreased balance, decreased endurance, decreased mobility, decreased ROM, decreased strength, impaired flexibility, impaired LE use, postural dysfunction, and pain.   ACTIVITY LIMITATIONS: bending, lifting, carry, locomotion, cleaning, community activity, driving, and or occupation   PERSONAL FACTORS: OA, anx, dep, chronic pain, are also affecting patient's functional outcome.     REHAB POTENTIAL: Good   CLINICAL DECISION MAKING: stable/uncomplicated   EVALUATION COMPLEXITY: Low       GOALS:     Long term PT goals (target dates for all long term goals are 6 weeks 05/15/21) Pt will improve lumbar and hip ROM to [facility] to improve functional mobility Baseline: Goal status: On-going 04/30/2021 Pt will improve  hip/knee strength to at least 5-/5 MMT to improve functional strength Baseline: Goal status: On-going 04/30/2021 Pt will improve FOTO to at least 59% functional to show improved function Baseline: Goal status: New Pt will reduce pain by overall 50% overall with usual activity Baseline: Goal status: On-going 04/30/2021 Pt will reduce pain to overall less than 2-3/10 with usual activity and work activity. Baseline: Goal status: On-going 04/30/2021 Pt will be independent and compliant with HEP           Goal status: New   PLAN: PT FREQUENCY: 1-2 times per week    PT DURATION: 6 weeks   PLANNED INTERVENTIONS (unless contraindicated): aquatic PT, Canalith  repositioning, cryotherapy, Electrical stimulation, Iontophoresis with 4 mg/ml dexamethasome, Moist heat, traction, Ultrasound, gait training, Therapeutic exercise, balance training, neuromuscular re-education, patient/family education, prosthetic training, manual techniques, passive ROM, dry needling, taping, vasopnuematic device, vestibular, spinal manipulations, joint manipulations   PLAN FOR NEXT SESSION:  traction if desired, recert vs DC   Elsie Ra, PT, DPT 05/07/21 3:36 PM

## 2021-05-15 NOTE — Progress Notes (Signed)
Virtual Visit via Telephone Note ? ?I connected with Sheila Weber, on 05/20/2021 at 3:22 PM by telephone due to the COVID-19 pandemic and verified that I am speaking with the correct person using two identifiers. ? ?Due to current restrictions/limitations of in-office visits due to the COVID-19 pandemic, this scheduled clinical appointment was converted to a telehealth visit. ?  ?Consent: ?I discussed the limitations, risks, security and privacy concerns of performing an evaluation and management service by telephone and the availability of in person appointments. I also discussed with the patient that there may be a patient responsible charge related to this service. The patient expressed understanding and agreed to proceed. ? ? ?Location of Patient: ?Home ? ?Location of Provider: ?[facility] ? ? ?Persons participating in Telemedicine visit: ?Pooja Devargas ?Durene Fruits, NP ?Elmon Else, CMA ? ? ?History of Present Illness: ?Sheila Weber is a 46 year-old female who presents for insomnia follow-up. ? ?INSOMNIA FOLLOW-UP: ?04/19/2021: ?- Increase Trazodone from 100 mg at bedtime to 150 mg at bedtime. No refills needed as of present. Patient to take 1.5 tablets of current 100 mg tablets.  ? ?05/20/2021: ?Doing well on current Trazodone, no issues/concerns. ? ?2. ACID REFLUX FOLLOW-UP: ?04/19/2021: ?- Increase Omeprazole from 20 mg daily to 40 mg daily. No refills needed as of present. Patient to take 2 capsules of current 20 mg capsules.  ? ?05/20/2021: ?Doing well on current Omeprazole, no issues/concerns.  ? ? ?Past Medical History:  ?Diagnosis Date  ? Anxiety   ? Chronic back pain greater than 3 months duration   ? Depression   ? ?No Known Allergies ? ?Current Outpatient Medications on File Prior to Visit  ?Medication Sig Dispense Refill  ? cyclobenzaprine (FLEXERIL) 10 MG tablet Take 1 tablet (10 mg total) by mouth 2 (two) times daily as needed for muscle spasms. 30 tablet 0  ?  gabapentin (NEURONTIN) 300 MG capsule Take 1 capsule (300 mg total) by mouth daily. 90 capsule 0  ? levonorgestrel-ethinyl estradiol (AVIANE) 0.1-20 MG-MCG tablet Take 1 tablet by mouth daily. 28 tablet 11  ? Meloxicam 15 MG TBDP One qd for pain and inflammation 30 tablet 0  ? naproxen (NAPROSYN) 500 MG tablet Take 1 tablet (500 mg total) by mouth 2 (two) times daily with a meal. 30 tablet 0  ? omeprazole (PRILOSEC) 20 MG capsule Take 1 capsule (20 mg total) by mouth daily. 90 capsule 0  ? traZODone (DESYREL) 100 MG tablet Take 1 tablet (100 mg total) by mouth at bedtime as needed for sleep. 30 tablet 1  ? ?No current facility-administered medications on file prior to visit.  ? ? ?Observations/Objective: ?Alert and oriented x 3. Not in acute distress. Physical examination not completed as this is a telemedicine visit. ? ?Assessment and Plan: ?1. Insomnia, unspecified type: ?- Continue Trazodone as prescribed.  ?- Follow-up with primary provider in 4 months or sooner if needed.  ?- traZODone (DESYREL) 100 MG tablet; Take 1.5 tablets (150 mg total) by mouth at bedtime as needed for sleep.  Dispense: 45 tablet; Refill: 3 ? ?2. Gastroesophageal reflux disease, unspecified whether esophagitis present: ?- Continue Omeprazole as prescribed.  ?- Follow-up with primary provider in 4 months or sooner if needed.  ?- omeprazole (PRILOSEC) 20 MG capsule; Take 2 capsules (40 mg total) by mouth daily.  Dispense: 180 capsule; Refill: 1 ? ? ?Follow Up Instructions: ?Follow-up with primary provider in 4 months or sooner if needed.  ?  ?Patient was  given clear instructions to go to Emergency Department or return to medical center if symptoms don't improve, worsen, or new problems develop.The patient verbalized understanding. ? ?I discussed the assessment and treatment plan with the patient. The patient was provided an opportunity to ask questions and all were answered. The patient agreed with the plan and demonstrated an understanding  of the instructions. ?  ?The patient was advised to call back or seek an in-person evaluation if the symptoms worsen or if the condition fails to improve as anticipated. ? ? ? ?I provided 5 minutes total of non-face-to-face time during this encounter. ? ? ?Camillia Herter, NP  ?[facility] ?[place], [place] ?[phone] ?05/20/2021, 3:22 PM ?

## 2021-05-20 ENCOUNTER — Ambulatory Visit (INDEPENDENT_AMBULATORY_CARE_PROVIDER_SITE_OTHER): Payer: 59 | Admitting: Family

## 2021-05-20 DIAGNOSIS — K219 Gastro-esophageal reflux disease without esophagitis: Secondary | ICD-10-CM | POA: Diagnosis not present

## 2021-05-20 DIAGNOSIS — G4700 Insomnia, unspecified: Secondary | ICD-10-CM | POA: Diagnosis not present

## 2021-05-20 MED ORDER — OMEPRAZOLE 20 MG PO CPDR: 20.0000 mg | DELAYED_RELEASE_CAPSULE | Freq: Every day | ORAL | 1 refills | Status: DC

## 2021-05-20 MED ORDER — OMEPRAZOLE 20 MG PO CPDR: 40.0000 mg | DELAYED_RELEASE_CAPSULE | Freq: Every day | ORAL | 1 refills | Status: DC

## 2021-05-20 MED ORDER — TRAZODONE HCL 100 MG PO TABS: 150.0000 mg | ORAL_TABLET | Freq: Every evening | ORAL | 3 refills | Status: DC | PRN

## 2021-05-23 ENCOUNTER — Encounter: Payer: 59 | Admitting: Physical Therapy

## 2021-05-30 ENCOUNTER — Encounter: Payer: Self-pay | Admitting: Family

## 2021-09-14 IMAGING — MG MM DIGITAL DIAGNOSTIC UNILAT*R* W/ TOMO W/ CAD
4 series · 4 of 12 positions shown · non-contrast
Comparison: Previous exam(s).

CLINICAL DATA: Six-month follow-up of right breast mass.

EXAM:
DIGITAL DIAGNOSTIC RIGHT MAMMOGRAM WITH CAD AND TOMO
ULTRASOUND RIGHT BREAST

[R MLO synth-2D]
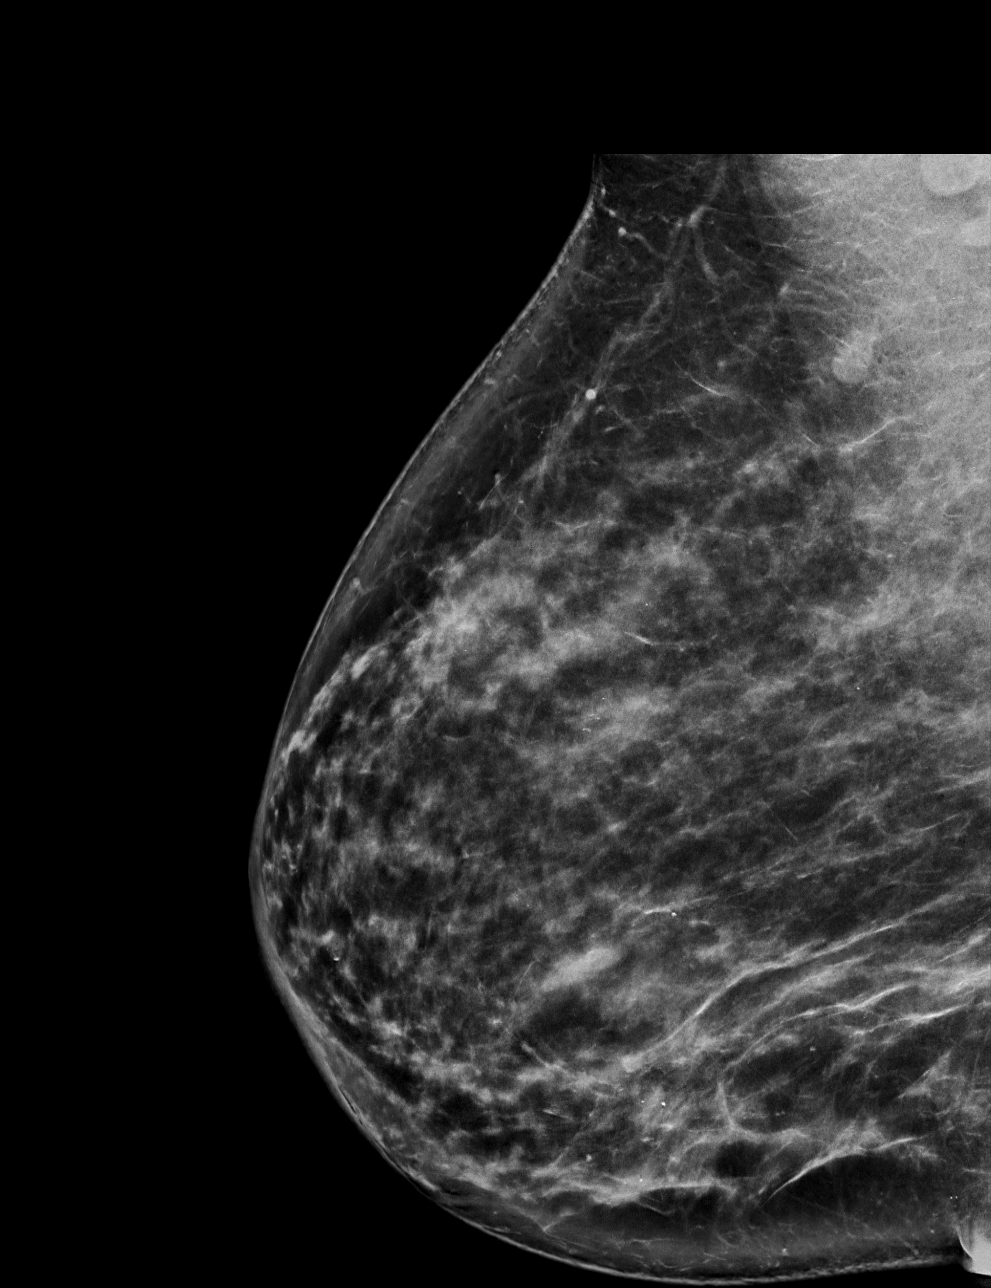

[R CC synth-2D]
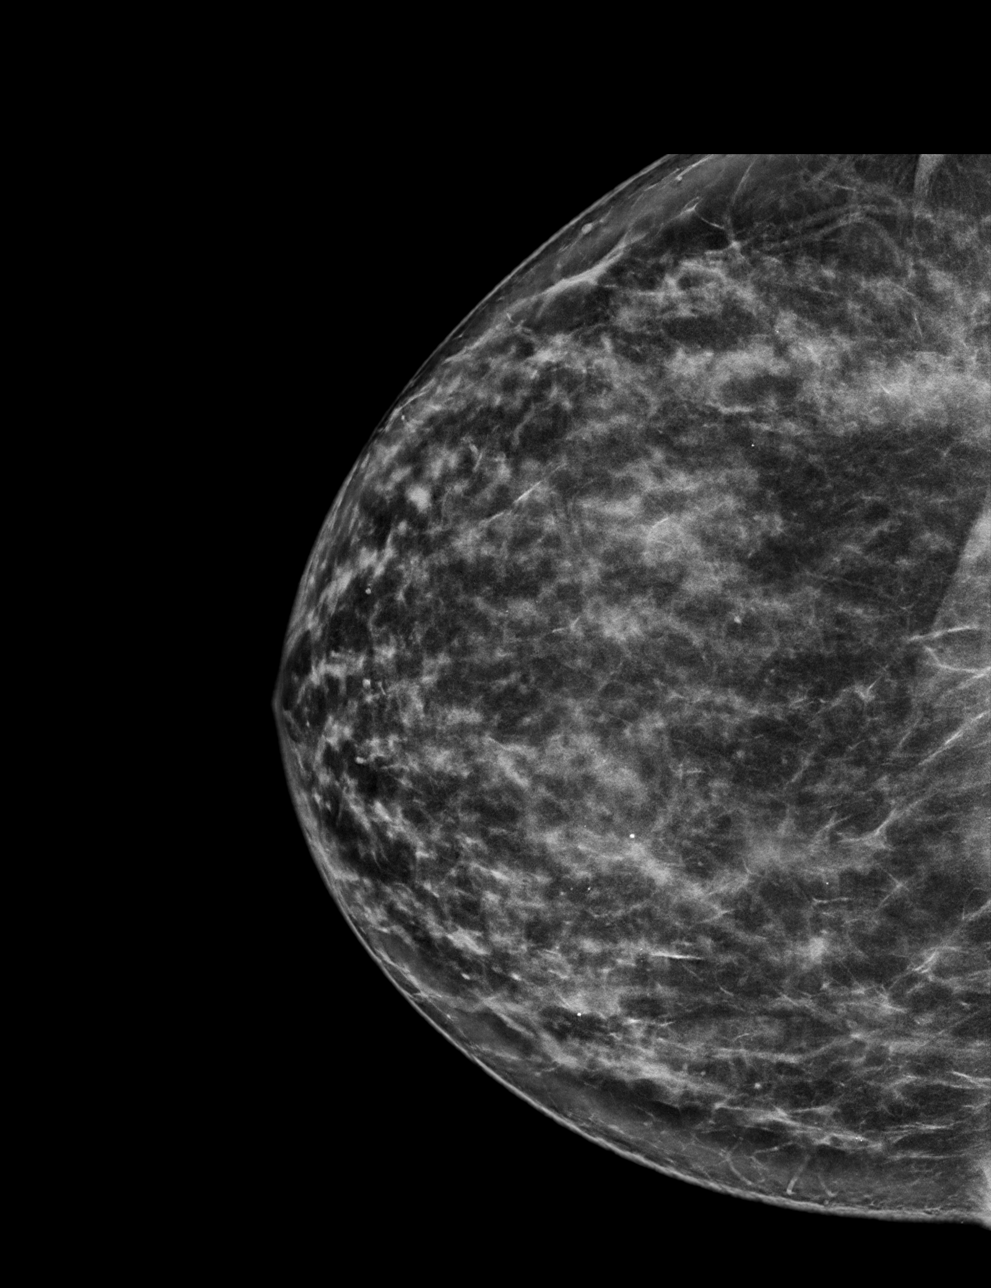

[R CC tomo · tomo slice 41/80.0]
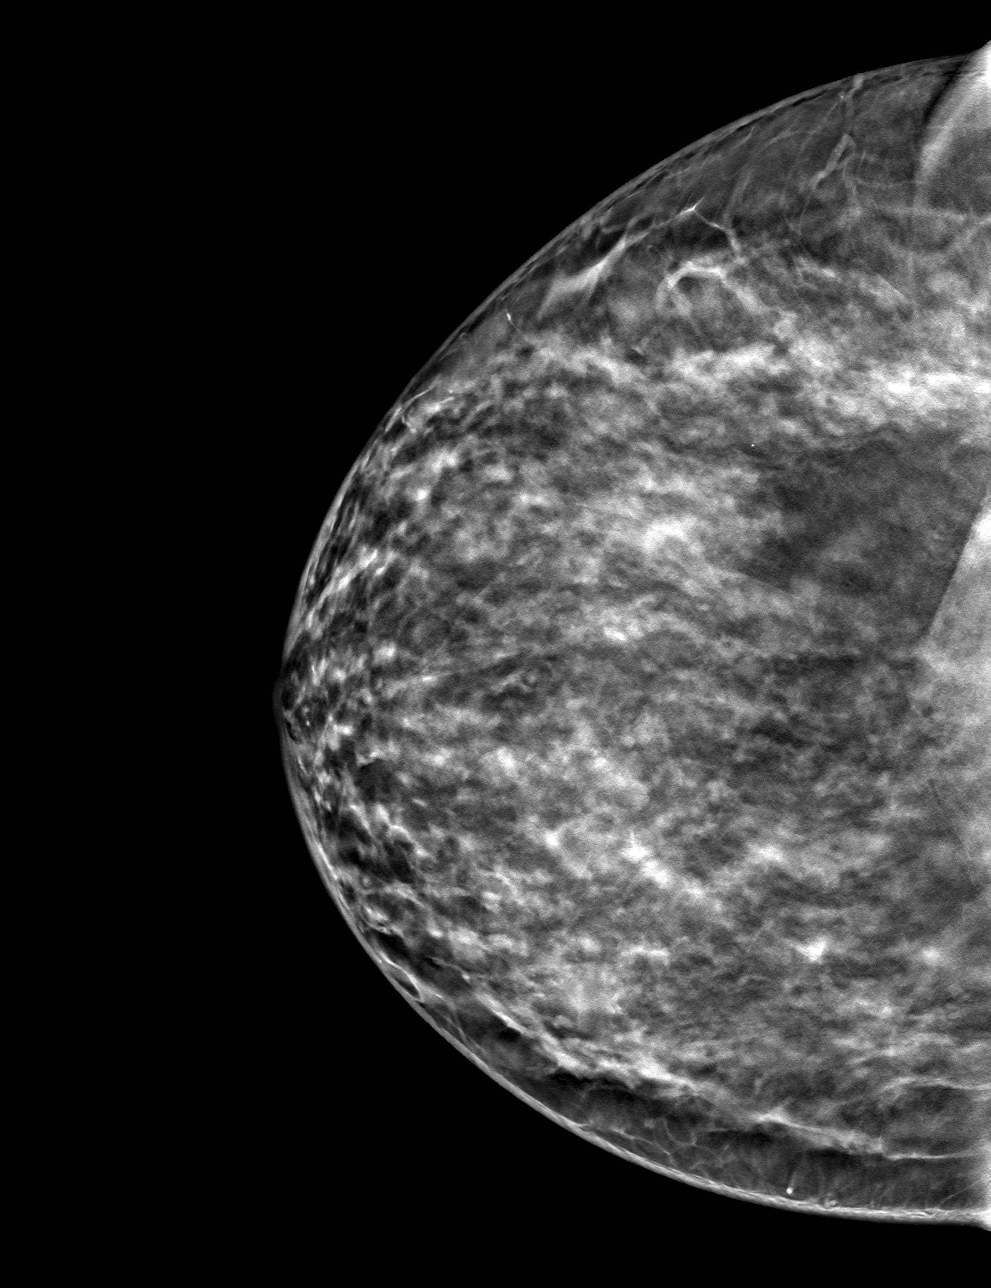

[R MLO tomo · tomo slice 45/90.0]
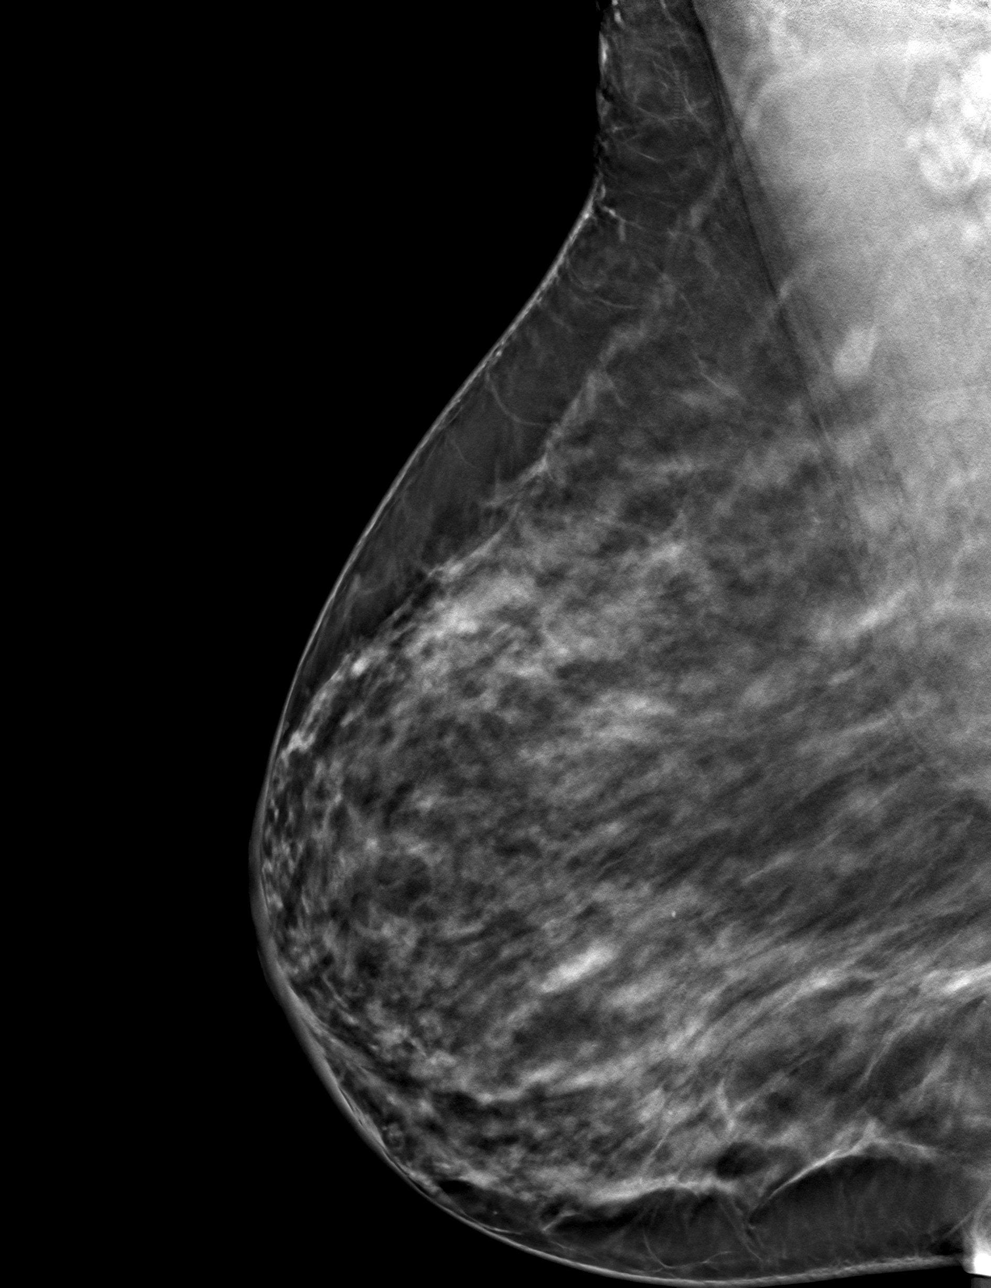

[4 of 12 positions shown; findings below may reference images not displayed]

ACR Breast Density Category c: The breast tissue is heterogeneously
dense, which may obscure small masses.
FINDINGS: The mass at 5 o'clock in the right breast is stable
mammographically. No other suspicious findings in the right breast.

Mammographic images were processed with CAD.

On physical exam, no suspicious lumps are identified.

Targeted ultrasound is performed, showing a mass in the right breast
at 5 o'clock, 4 cm from the nipple measuring 5 by 6 x 2 mm today,
not significantly changed in the interval.
IMPRESSION: Stable probably benign right breast mass.

RECOMMENDATION:
Six-month follow-up mammogram and ultrasound of the probably benign
right breast mass. The patient will be due for bilateral mammography
at that time.

I have discussed the findings and recommendations with the patient.
If applicable, a reminder letter will be sent to the patient
regarding the next appointment.

BI-RADS CATEGORY  3: Probably benign.

## 2022-03-11 NOTE — Progress Notes (Unsigned)
Patient ID: Sheila Weber, female    DOB: Sep 20, 1975  MRN: [id number]  CC: Annual Physical Exam  Subjective: Sheila Weber is a 47 y.o. female who presents for annual physical exam.  Her concerns today include:  Mammo  Colon ca  Patient Active Problem List   Diagnosis Date Noted   Low back pain 03/26/2021   Prediabetes 02/26/2021     Current Outpatient Medications on File Prior to Visit  Medication Sig Dispense Refill   cyclobenzaprine (FLEXERIL) 10 MG tablet Take 1 tablet (10 mg total) by mouth 2 (two) times daily as needed for muscle spasms. 30 tablet 0   gabapentin (NEURONTIN) 300 MG capsule Take 1 capsule (300 mg total) by mouth daily. 90 capsule 0   levonorgestrel-ethinyl estradiol (AVIANE) 0.1-20 MG-MCG tablet Take 1 tablet by mouth daily. 28 tablet 11   Meloxicam 15 MG TBDP One qd for pain and inflammation 30 tablet 0   naproxen (NAPROSYN) 500 MG tablet Take 1 tablet (500 mg total) by mouth 2 (two) times daily with a meal. 30 tablet 0   omeprazole (PRILOSEC) 20 MG capsule Take 2 capsules (40 mg total) by mouth daily. 180 capsule 1   traZODone (DESYREL) 100 MG tablet Take 1.5 tablets (150 mg total) by mouth at bedtime as needed for sleep. 45 tablet 3   No current facility-administered medications on file prior to visit.    No Known Allergies  Social History   Socioeconomic History   Marital status: Single    Spouse name: Not on file   Number of children: Not on file   Years of education: Not on file   Highest education level: Not on file  Occupational History   Not on file  Tobacco Use   Smoking status: Never    Passive exposure: Never   Smokeless tobacco: Never  Vaping Use   Vaping Use: Never used  Substance and Sexual Activity   Alcohol use: No   Drug use: No   Sexual activity: Yes    Birth control/protection: None  Other Topics Concern   Not on file  Social History Narrative   Not on file   Social Determinants of Health   Financial Resource  Strain: Not on file  Food Insecurity: Not on file  Transportation Needs: Not on file  Physical Activity: Not on file  Stress: Not on file  Social Connections: Not on file  Intimate Partner Violence: Not on file    Family History  Problem Relation Age of Onset   Hypertension Mother    Asthma Mother    Diabetes Father     Past Surgical History:  Procedure Laterality Date   CESAREAN SECTION      ROS: Review of Systems Negative except as stated above  PHYSICAL EXAM: There were no vitals taken for this visit.  Physical Exam  {female adult master:310786} {female adult master:310785}     Latest Ref Rng & Units 02/25/2021   11:34 AM 01/17/2020    9:38 AM 01/12/2019   10:39 AM  CMP  Glucose 70 - 99 mg/dL 91  82  99   BUN 6 - 24 mg/dL 10  16  11   $ Creatinine 0.57 - 1.00 mg/dL 0.68  0.88  0.78   Sodium 134 - 144 mmol/L 138  140  139   Potassium 3.5 - 5.2 mmol/L 4.1  4.4  4.6   Chloride 96 - 106 mmol/L 98  103  103   CO2 20 - 29 mmol/L  $23  25  23   Y$ Calcium 8.7 - 10.2 mg/dL 9.2  9.3  9.3   Total Protein 6.0 - 8.5 g/dL 7.2  7.5  7.0   Total Bilirubin 0.0 - 1.2 mg/dL 0.3  0.3  0.2   Alkaline Phos 44 - 121 IU/L 90  55  55   AST 0 - 40 IU/L 19  14  15   $ ALT 0 - 32 IU/L 18  14  13    $ Lipid Panel     Component Value Date/Time   CHOL 158 02/25/2021 1134   TRIG 50 02/25/2021 1134   HDL 48 02/25/2021 1134   CHOLHDL 3.3 02/25/2021 1134   LDLCALC 100 (H) 02/25/2021 1134    CBC    Component Value Date/Time   WBC 4.1 02/25/2021 1134   RBC 3.84 02/25/2021 1134   HGB 11.5 02/25/2021 1134   HCT 35.1 02/25/2021 1134   PLT 309 02/25/2021 1134   MCV 91 02/25/2021 1134   MCH 29.9 02/25/2021 1134   MCHC 32.8 02/25/2021 1134   RDW 12.7 02/25/2021 1134   LYMPHSABS 2.0 01/17/2020 0938   EOSABS 0.1 01/17/2020 0938   BASOSABS 0.0 01/17/2020 [id number]    ASSESSMENT AND PLAN:  There are no diagnoses linked to this encounter.   Patient was given the opportunity to ask questions.   Patient verbalized understanding of the plan and was able to repeat key elements of the plan. Patient was given clear instructions to go to Emergency Department or return to medical center if symptoms don't improve, worsen, or new problems develop.The patient verbalized understanding.   No orders of the defined types were placed in this encounter.    Requested Prescriptions    No prescriptions requested or ordered in this encounter    No follow-ups on file.  Camillia Herter, NP

## 2022-03-13 ENCOUNTER — Encounter: Payer: Self-pay | Admitting: Family

## 2022-03-13 ENCOUNTER — Other Ambulatory Visit (HOSPITAL_COMMUNITY)
Admission: RE | Admit: 2022-03-13 | Discharge: 2022-03-13 | Disposition: A | Discharge status: Home / Self Care | Payer: Commercial Managed Care - PPO | Source: Ambulatory Visit | Attending: Family | Admitting: Family

## 2022-03-13 ENCOUNTER — Ambulatory Visit (INDEPENDENT_AMBULATORY_CARE_PROVIDER_SITE_OTHER): Payer: Commercial Managed Care - PPO | Admitting: Family

## 2022-03-13 VITALS — BP 115/76 | HR 76 | Temp 98.3°F | Resp 16 | Ht 66.73 in | Wt 189.0 lb

## 2022-03-13 DIAGNOSIS — Z0001 Encounter for general adult medical examination with abnormal findings: Secondary | ICD-10-CM | POA: Diagnosis not present

## 2022-03-13 DIAGNOSIS — Z1231 Encounter for screening mammogram for malignant neoplasm of breast: Secondary | ICD-10-CM

## 2022-03-13 DIAGNOSIS — Z1211 Encounter for screening for malignant neoplasm of colon: Secondary | ICD-10-CM

## 2022-03-13 DIAGNOSIS — Z0100 Encounter for examination of eyes and vision without abnormal findings: Secondary | ICD-10-CM

## 2022-03-13 DIAGNOSIS — R350 Frequency of micturition: Secondary | ICD-10-CM

## 2022-03-13 DIAGNOSIS — G5601 Carpal tunnel syndrome, right upper limb: Secondary | ICD-10-CM

## 2022-03-13 DIAGNOSIS — Z1329 Encounter for screening for other suspected endocrine disorder: Secondary | ICD-10-CM

## 2022-03-13 DIAGNOSIS — Z0000 Encounter for general adult medical examination without abnormal findings: Secondary | ICD-10-CM

## 2022-03-13 DIAGNOSIS — R7303 Prediabetes: Secondary | ICD-10-CM | POA: Diagnosis not present

## 2022-03-13 DIAGNOSIS — Z13220 Encounter for screening for lipoid disorders: Secondary | ICD-10-CM | POA: Diagnosis not present

## 2022-03-13 DIAGNOSIS — Z13228 Encounter for screening for other metabolic disorders: Secondary | ICD-10-CM

## 2022-03-13 DIAGNOSIS — K219 Gastro-esophageal reflux disease without esophagitis: Secondary | ICD-10-CM

## 2022-03-13 DIAGNOSIS — Z130 Encounter for screening for diseases of the blood and blood-forming organs and certain disorders involving the immune mechanism: Secondary | ICD-10-CM

## 2022-03-13 LAB — POCT URINALYSIS DIP (CLINITEK)
Bilirubin, UA: NEGATIVE
Glucose, UA: NEGATIVE mg/dL
Ketones, POC UA: NEGATIVE mg/dL
Leukocytes, UA: NEGATIVE
Nitrite, UA: NEGATIVE
POC PROTEIN,UA: NEGATIVE
Spec Grav, UA: >=1.03 — AB (ref 1.010–1.025)
Urobilinogen, UA: 0.2 E.U./dL
pH, UA: 7 (ref 5.0–8.0)

## 2022-03-13 MED ORDER — GABAPENTIN 300 MG PO CAPS: 300.0000 mg | ORAL_CAPSULE | Freq: Every day | ORAL | 2 refills | Status: AC

## 2022-03-13 MED ORDER — OMEPRAZOLE 20 MG PO CPDR: 40.0000 mg | DELAYED_RELEASE_CAPSULE | Freq: Every day | ORAL | 2 refills | Status: DC

## 2022-03-13 NOTE — Progress Notes (Signed)
.  Pt presents for annual physical exam   -experiencing frequent urination -needs refill on Gabapentin and Omeprazole -completed Cologuard 03/15/21

## 2022-03-13 NOTE — Patient Instructions (Signed)
Preventive Care 49-49 Years Old, Female Preventive care refers to lifestyle choices and visits with your health care provider that can promote health and wellness. Preventive care visits are also called wellness exams. What can I expect for my preventive care visit? Counseling Your health care provider may ask you questions about your: Medical history, including: Past medical problems. Family medical history. Pregnancy history. Current health, including: Menstrual cycle. Method of birth control. Emotional well-being. Home life and relationship well-being. Sexual activity and sexual health. Lifestyle, including: Alcohol, nicotine or tobacco, and drug use. Access to firearms. Diet, exercise, and sleep habits. Work and work environment. Sunscreen use. Safety issues such as seatbelt and bike helmet use. Physical exam Your health care provider will check your: Height and weight. These may be used to calculate your BMI (body mass index). BMI is a measurement that tells if you are at a healthy weight. Waist circumference. This measures the distance around your waistline. This measurement also tells if you are at a healthy weight and may help predict your risk of certain diseases, such as type 2 diabetes and high blood pressure. Heart rate and blood pressure. Body temperature. Skin for abnormal spots. What immunizations do I need?  Vaccines are usually given at various ages, according to a schedule. Your health care provider will recommend vaccines for you based on your age, medical history, and lifestyle or other factors, such as travel or where you work. What tests do I need? Screening Your health care provider may recommend screening tests for certain conditions. This may include: Lipid and cholesterol levels. Diabetes screening. This is done by checking your blood sugar (glucose) after you have not eaten for a while (fasting). Pelvic exam and Pap test. Hepatitis B test. Hepatitis C  test. HIV (human immunodeficiency virus) test. STI (sexually transmitted infection) testing, if you are at risk. Lung cancer screening. Colorectal cancer screening. Mammogram. Talk with your health care provider about when you should start having regular mammograms. This may depend on whether you have a family history of breast cancer. BRCA-related cancer screening. This may be done if you have a family history of breast, ovarian, tubal, or peritoneal cancers. Bone density scan. This is done to screen for osteoporosis. Talk with your health care provider about your test results, treatment options, and if necessary, the need for more tests. Follow these instructions at home: Eating and drinking  Eat a diet that includes fresh fruits and vegetables, whole grains, lean protein, and low-fat dairy products. Take vitamin and mineral supplements as recommended by your health care provider. Do not drink alcohol if: Your health care provider tells you not to drink. You are pregnant, may be pregnant, or are planning to become pregnant. If you drink alcohol: Limit how much you have to 0-1 drink a day. Know how much alcohol is in your drink. In the [place] one drink equals one 12 oz bottle of beer (355 mL), one 5 oz glass of wine (148 mL), or one 1 oz glass of hard liquor (44 mL). Lifestyle Brush your teeth every morning and night with fluoride toothpaste. Floss one time each day. Exercise for at least 30 minutes 5 or more days each week. Do not use any products that contain nicotine or tobacco. These products include cigarettes, chewing tobacco, and vaping devices, such as e-cigarettes. If you need help quitting, ask your health care provider. Do not use drugs. If you are sexually active, practice safe sex. Use a condom or other form of protection to   prevent STIs. If you do not wish to become pregnant, use a form of birth control. If you plan to become pregnant, see your health care provider for a  prepregnancy visit. Take aspirin only as told by your health care provider. Make sure that you understand how much to take and what form to take. Work with your health care provider to find out whether it is safe and beneficial for you to take aspirin daily. Find healthy ways to manage stress, such as: Meditation, yoga, or listening to music. Journaling. Talking to a trusted person. Spending time with friends and family. Minimize exposure to UV radiation to reduce your risk of skin cancer. Safety Always wear your seat belt while driving or riding in a vehicle. Do not drive: If you have been drinking alcohol. Do not ride with someone who has been drinking. When you are tired or distracted. While texting. If you have been using any mind-altering substances or drugs. Wear a helmet and other protective equipment during sports activities. If you have firearms in your house, make sure you follow all gun safety procedures. Seek help if you have been physically or sexually abused. What's next? Visit your health care provider once a year for an annual wellness visit. Ask your health care provider how often you should have your eyes and teeth checked. Stay up to date on all vaccines. This information is not intended to replace advice given to you by your health care provider. Make sure you discuss any questions you have with your health care provider. Document Revised: 07/11/2020 Document Reviewed: 07/11/2020 Elsevier Patient Education  2023 Elsevier Inc.  

## 2022-03-14 ENCOUNTER — Other Ambulatory Visit: Payer: Self-pay | Admitting: Family

## 2022-03-14 DIAGNOSIS — B3731 Acute candidiasis of vulva and vagina: Secondary | ICD-10-CM | POA: Insufficient documentation

## 2022-03-14 LAB — CMP14+EGFR
ALT: 27 IU/L (ref 0–32)
AST: 26 IU/L (ref 0–40)
Albumin/Globulin Ratio: 1.7 (ref 1.2–2.2)
Albumin: 4.5 g/dL (ref 3.9–4.9)
Alkaline Phosphatase: 82 IU/L (ref 44–121)
BUN/Creatinine Ratio: 18 (ref 9–23)
BUN: 17 mg/dL (ref 6–24)
Bilirubin Total: <0.2 mg/dL (ref 0.0–1.2)
CO2: 24 mmol/L (ref 20–29)
Calcium: 9.6 mg/dL (ref 8.7–10.2)
Chloride: 104 mmol/L (ref 96–106)
Creatinine, Ser: 0.93 mg/dL (ref 0.57–1.00)
Globulin, Total: 2.7 g/dL (ref 1.5–4.5)
Glucose: 98 mg/dL (ref 70–99)
Potassium: 5.1 mmol/L (ref 3.5–5.2)
Sodium: 144 mmol/L (ref 134–144)
Total Protein: 7.2 g/dL (ref 6.0–8.5)
eGFR: 76 mL/min/{1.73_m2} (ref >59)

## 2022-03-14 LAB — HEMOGLOBIN A1C
Est. average glucose Bld gHb Est-mCnc: 128 mg/dL
Hgb A1c MFr Bld: 6.1 % — ABNORMAL HIGH (ref 4.8–5.6)

## 2022-03-14 LAB — LIPID PANEL
Chol/HDL Ratio: 3 ratio (ref 0.0–4.4)
Cholesterol, Total: 125 mg/dL (ref 100–199)
HDL: 42 mg/dL (ref >39)
LDL Chol Calc (NIH): 63 mg/dL (ref 0–99)
Triglycerides: 108 mg/dL (ref 0–149)
VLDL Cholesterol Cal: 20 mg/dL (ref 5–40)

## 2022-03-14 LAB — TSH: TSH: 0.813 u[IU]/mL (ref 0.450–4.500)

## 2022-03-14 LAB — CBC
Hematocrit: 37 % (ref 34.0–46.6)
Hemoglobin: 11.8 g/dL (ref 11.1–15.9)
MCH: 29.6 pg (ref 26.6–33.0)
MCHC: 31.9 g/dL (ref 31.5–35.7)
MCV: 93 fL (ref 79–97)
Platelets: 286 10*3/uL (ref 150–450)
RBC: 3.99 x10E6/uL (ref 3.77–5.28)
RDW: 12.7 % (ref 11.7–15.4)
WBC: 3.3 10*3/uL — ABNORMAL LOW (ref 3.4–10.8)

## 2022-03-14 LAB — CERVICOVAGINAL ANCILLARY ONLY
Bacterial Vaginitis (gardnerella): NEGATIVE
Candida Glabrata: NEGATIVE
Candida Vaginitis: POSITIVE — AB
Chlamydia: NEGATIVE
Comment: NEGATIVE
Comment: NEGATIVE
Comment: NEGATIVE
Comment: NEGATIVE
Comment: NEGATIVE
Comment: NORMAL
Neisseria Gonorrhea: NEGATIVE
Trichomonas: NEGATIVE

## 2022-03-14 MED ORDER — FLUCONAZOLE 150 MG PO TABS: 150.0000 mg | ORAL_TABLET | ORAL | 0 refills | Status: AC

## 2022-03-26 ENCOUNTER — Other Ambulatory Visit: Payer: Self-pay | Admitting: Family

## 2022-03-26 DIAGNOSIS — N6310 Unspecified lump in the right breast, unspecified quadrant: Secondary | ICD-10-CM

## 2022-03-26 DIAGNOSIS — Z1329 Encounter for screening for other suspected endocrine disorder: Secondary | ICD-10-CM

## 2022-03-26 DIAGNOSIS — G5601 Carpal tunnel syndrome, right upper limb: Secondary | ICD-10-CM

## 2022-03-26 DIAGNOSIS — Z0100 Encounter for examination of eyes and vision without abnormal findings: Secondary | ICD-10-CM

## 2022-03-26 DIAGNOSIS — Z13220 Encounter for screening for lipoid disorders: Secondary | ICD-10-CM

## 2022-03-26 DIAGNOSIS — Z13228 Encounter for screening for other metabolic disorders: Secondary | ICD-10-CM

## 2022-03-26 DIAGNOSIS — R350 Frequency of micturition: Secondary | ICD-10-CM

## 2022-03-26 DIAGNOSIS — Z130 Encounter for screening for diseases of the blood and blood-forming organs and certain disorders involving the immune mechanism: Secondary | ICD-10-CM

## 2022-03-26 DIAGNOSIS — Z0000 Encounter for general adult medical examination without abnormal findings: Secondary | ICD-10-CM

## 2022-03-26 DIAGNOSIS — R7303 Prediabetes: Secondary | ICD-10-CM

## 2022-03-26 DIAGNOSIS — K219 Gastro-esophageal reflux disease without esophagitis: Secondary | ICD-10-CM

## 2022-04-07 ENCOUNTER — Other Ambulatory Visit: Payer: Commercial Managed Care - PPO

## 2022-05-07 ENCOUNTER — Other Ambulatory Visit: Payer: Self-pay | Admitting: Family

## 2022-05-07 ENCOUNTER — Ambulatory Visit
Admission: RE | Admit: 2022-05-07 | Discharge: 2022-05-07 | Disposition: A | Discharge status: Home / Self Care | Payer: Commercial Managed Care - PPO | Source: Ambulatory Visit | Attending: Family | Admitting: Family

## 2022-05-07 DIAGNOSIS — N6310 Unspecified lump in the right breast, unspecified quadrant: Secondary | ICD-10-CM

## 2022-09-04 ENCOUNTER — Encounter: Payer: Self-pay | Admitting: Family

## 2022-09-16 ENCOUNTER — Ambulatory Visit: Payer: Commercial Managed Care - PPO | Admitting: Family

## 2022-09-23 ENCOUNTER — Ambulatory Visit: Payer: Commercial Managed Care - PPO | Admitting: Family

## 2023-06-10 ENCOUNTER — Ambulatory Visit (INDEPENDENT_AMBULATORY_CARE_PROVIDER_SITE_OTHER): Admitting: Family

## 2023-06-10 ENCOUNTER — Encounter: Payer: Self-pay | Admitting: Family

## 2023-06-10 VITALS — BP 130/85 | HR 74 | Temp 98.4°F | Resp 16 | Ht 68.0 in | Wt 198.2 lb

## 2023-06-10 DIAGNOSIS — K219 Gastro-esophageal reflux disease without esophagitis: Secondary | ICD-10-CM | POA: Diagnosis not present

## 2023-06-10 DIAGNOSIS — Z130 Encounter for screening for diseases of the blood and blood-forming organs and certain disorders involving the immune mechanism: Secondary | ICD-10-CM

## 2023-06-10 DIAGNOSIS — Z124 Encounter for screening for malignant neoplasm of cervix: Secondary | ICD-10-CM

## 2023-06-10 DIAGNOSIS — Z0000 Encounter for general adult medical examination without abnormal findings: Secondary | ICD-10-CM | POA: Diagnosis not present

## 2023-06-10 DIAGNOSIS — Z1329 Encounter for screening for other suspected endocrine disorder: Secondary | ICD-10-CM | POA: Diagnosis not present

## 2023-06-10 DIAGNOSIS — Z1231 Encounter for screening mammogram for malignant neoplasm of breast: Secondary | ICD-10-CM | POA: Diagnosis not present

## 2023-06-10 DIAGNOSIS — Z13228 Encounter for screening for other metabolic disorders: Secondary | ICD-10-CM

## 2023-06-10 DIAGNOSIS — Z131 Encounter for screening for diabetes mellitus: Secondary | ICD-10-CM

## 2023-06-10 DIAGNOSIS — Z13220 Encounter for screening for lipoid disorders: Secondary | ICD-10-CM

## 2023-06-10 MED ORDER — OMEPRAZOLE 20 MG PO CPDR: 40.0000 mg | DELAYED_RELEASE_CAPSULE | Freq: Every day | ORAL | 0 refills | Status: AC

## 2023-06-10 NOTE — Progress Notes (Signed)
 Urinate a lot, insomnia, throat dry and ache

## 2023-06-10 NOTE — Progress Notes (Signed)
 Patient ID: Jamin Train, female    DOB: 13-Jan-1976  MRN: [id number]  CC: Annual Exam  Subjective: Sheila Weber is a 48 y.o. female who presents for annual exam.  Her concerns today include:  - Doing well on Omeprazole , no issues/concerns. - States she does not want to discuss urinary issues, insomnia, or dry throat/sore throat on today.  Patient Active Problem List   Diagnosis Date Noted   Candida vaginitis 03/14/2022   Low back pain 03/26/2021   Prediabetes 02/26/2021     Current Outpatient Medications on File Prior to Visit  Medication Sig Dispense Refill   gabapentin  (NEURONTIN ) 300 MG capsule Take 1 capsule (300 mg total) by mouth daily. 30 capsule 2   No current facility-administered medications on file prior to visit.    No Known Allergies  Social History   Socioeconomic History   Marital status: Single    Spouse name: Not on file   Number of children: Not on file   Years of education: Not on file   Highest education level: Not on file  Occupational History   Not on file  Tobacco Use   Smoking status: Never    Passive exposure: Never   Smokeless tobacco: Never  Vaping Use   Vaping status: Never Used  Substance and Sexual Activity   Alcohol use: No   Drug use: No   Sexual activity: Yes    Birth control/protection: None  Other Topics Concern   Not on file  Social History Narrative   Not on file   Social Drivers of Health   Financial Resource Strain: Medium Risk (06/10/2023)   Overall Financial Resource Strain (CARDIA)    Difficulty of Paying Living Expenses: Somewhat hard  Food Insecurity: No Food Insecurity (06/10/2023)   Hunger Vital Sign    Worried About Running Out of Food in the Last Year: Never true    Ran Out of Food in the Last Year: Never true  Transportation Needs: Unmet Transportation Needs (06/10/2023)   PRAPARE - Administrator, Civil Service (Medical): Yes    Lack of Transportation (Non-Medical): No  Physical  Activity: Inactive (06/10/2023)   Exercise Vital Sign    Days of Exercise per Week: 0 days    Minutes of Exercise per Session: 0 min  Stress: No Stress Concern Present (06/10/2023)   [employer] of Occupational Health - Occupational Stress Questionnaire    Feeling of Stress : Only a little  Social Connections: Moderately Isolated (06/10/2023)   Social Connection and Isolation Panel [NHANES]    Frequency of Communication with Friends and Family: Once a week    Frequency of Social Gatherings with Friends and Family: Once a week    Attends Religious Services: 1 to 4 times per year    Active Member of [employer] or Organizations: No    Attends Banker Meetings: Never    Marital Status: Living with partner  Intimate Partner Violence: Not At Risk (06/10/2023)   Humiliation, Afraid, Rape, and Kick questionnaire    Fear of Current or Ex-Partner: No    Emotionally Abused: No    Physically Abused: No    Sexually Abused: No    Family History  Problem Relation Age of Onset   Hypertension Mother    Asthma Mother    Diabetes Father     Past Surgical History:  Procedure Laterality Date   CESAREAN SECTION      ROS: Review of Systems Negative except as stated above  PHYSICAL EXAM: BP 130/85   Pulse 74   Temp 98.4 F (36.9 C) (Oral)   Resp 16   Ht 5\' 8"  (1.727 m)   Wt 198 lb 3.2 oz (89.9 kg)   LMP  (LMP Unknown)   SpO2 96%   BMI 30.14 kg/m   Physical Exam HENT:     Head: Normocephalic and atraumatic.     Right Ear: Tympanic membrane, ear canal and external ear normal.     Left Ear: Tympanic membrane, ear canal and external ear normal.     Nose: Nose normal.     Mouth/Throat:     Mouth: Mucous membranes are moist.     Pharynx: Oropharynx is clear.  Eyes:     Extraocular Movements: Extraocular movements intact.     Conjunctiva/sclera: Conjunctivae normal.     Pupils: Pupils are equal, round, and reactive to light.  Neck:     Thyroid : No thyroid  mass,  thyromegaly or thyroid  tenderness.  Cardiovascular:     Rate and Rhythm: Normal rate and regular rhythm.     Pulses: Normal pulses.     Heart sounds: Normal heart sounds.  Pulmonary:     Effort: Pulmonary effort is normal.     Breath sounds: Normal breath sounds.  Chest:     Comments: Patient declined. Abdominal:     General: Bowel sounds are normal.     Palpations: Abdomen is soft.  Genitourinary:    Comments: Patient declined. Musculoskeletal:        General: Normal range of motion.     Right shoulder: Normal.     Left shoulder: Normal.     Right upper arm: Normal.     Left upper arm: Normal.     Right elbow: Normal.     Left elbow: Normal.     Right forearm: Normal.     Left forearm: Normal.     Right wrist: Normal.     Left wrist: Normal.     Right hand: Normal.     Left hand: Normal.     Cervical back: Normal, normal range of motion and neck supple.     Thoracic back: Normal.     Lumbar back: Normal.     Right hip: Normal.     Left hip: Normal.     Right upper leg: Normal.     Left upper leg: Normal.     Right knee: Normal.     Left knee: Normal.     Right lower leg: Normal.     Left lower leg: Normal.     Right ankle: Normal.     Left ankle: Normal.     Right foot: Normal.     Left foot: Normal.  Skin:    General: Skin is warm and dry.     Capillary Refill: Capillary refill takes less than 2 seconds.  Neurological:     General: No focal deficit present.     Mental Status: She is alert and oriented to person, place, and time.  Psychiatric:        Mood and Affect: Mood normal.        Behavior: Behavior normal.     ASSESSMENT AND PLAN: 1. Annual physical exam (Primary) - Counseled on 150 minutes of exercise per week as tolerated, healthy eating (including decreased daily intake of saturated fats, cholesterol, added sugars, sodium), STI prevention, and routine healthcare maintenance.  2. Screening for metabolic disorder - Routine screening.  -  CMP14+EGFR  3. Screening  for deficiency anemia - Routine screening.  - CBC  4. Diabetes mellitus screening - Routine screening.  - Hemoglobin A1c  5. Screening cholesterol level - Routine screening.  - Lipid panel  6. Thyroid  disorder screen - Routine screening.  - TSH  7. Encounter for screening mammogram for malignant neoplasm of breast - Routine screening.  - MM Digital Screening; Future  8. Pap smear for cervical cancer screening - Referral to Gynecology for evaluation/management. - Ambulatory referral to Gynecology  9. Gastroesophageal reflux disease, unspecified whether esophagitis present - Continue Omeprazole  as prescribed. Counseled on medication adherence/adverse effects.  - Follow-up with primary provider as scheduled. - omeprazole  (PRILOSEC) 20 MG capsule; Take 2 capsules (40 mg total) by mouth daily.  Dispense: 180 capsule; Refill: 0   Patient was given the opportunity to ask questions.  Patient verbalized understanding of the plan and was able to repeat key elements of the plan. Patient was given clear instructions to go to Emergency Department or return to medical center if symptoms don't improve, worsen, or new problems develop.The patient verbalized understanding.   Orders Placed This Encounter  Procedures   MM Digital Screening   CBC   Lipid panel   CMP14+EGFR   Hemoglobin A1c   TSH   Ambulatory referral to Gynecology     Requested Prescriptions   Signed Prescriptions Disp Refills   omeprazole  (PRILOSEC) 20 MG capsule 180 capsule 0    Sig: Take 2 capsules (40 mg total) by mouth daily.    Return in about 1 year (around 06/09/2024) for Physical per patient preference.  Senaida Dama, NP

## 2023-06-11 ENCOUNTER — Ambulatory Visit: Payer: Self-pay | Admitting: Family

## 2023-06-11 LAB — CMP14+EGFR
ALT: 25 IU/L (ref 0–32)
AST: 15 IU/L (ref 0–40)
Albumin: 4.5 g/dL (ref 3.9–4.9)
Alkaline Phosphatase: 81 IU/L (ref 44–121)
BUN/Creatinine Ratio: 19 (ref 9–23)
BUN: 18 mg/dL (ref 6–24)
Bilirubin Total: <0.2 mg/dL (ref 0.0–1.2)
CO2: 24 mmol/L (ref 20–29)
Calcium: 8.8 mg/dL (ref 8.7–10.2)
Chloride: 102 mmol/L (ref 96–106)
Creatinine, Ser: 0.95 mg/dL (ref 0.57–1.00)
Globulin, Total: 2.2 g/dL (ref 1.5–4.5)
Glucose: 84 mg/dL (ref 70–99)
Potassium: 4.5 mmol/L (ref 3.5–5.2)
Sodium: 140 mmol/L (ref 134–144)
Total Protein: 6.7 g/dL (ref 6.0–8.5)
eGFR: 73 mL/min/{1.73_m2} (ref >59)

## 2023-06-11 LAB — LIPID PANEL
Chol/HDL Ratio: 3.3 ratio (ref 0.0–4.4)
Cholesterol, Total: 135 mg/dL (ref 100–199)
HDL: 41 mg/dL (ref >39)
LDL Chol Calc (NIH): 79 mg/dL (ref 0–99)
Triglycerides: 75 mg/dL (ref 0–149)
VLDL Cholesterol Cal: 15 mg/dL (ref 5–40)

## 2023-06-11 LAB — TSH: TSH: 1.3 u[IU]/mL (ref 0.450–4.500)

## 2023-06-11 LAB — HEMOGLOBIN A1C
Est. average glucose Bld gHb Est-mCnc: 126 mg/dL
Hgb A1c MFr Bld: 6 % — ABNORMAL HIGH (ref 4.8–5.6)

## 2023-06-11 LAB — CBC
Hematocrit: 37.4 % (ref 34.0–46.6)
Hemoglobin: 11.8 g/dL (ref 11.1–15.9)
MCH: 29.5 pg (ref 26.6–33.0)
MCHC: 31.6 g/dL (ref 31.5–35.7)
MCV: 94 fL (ref 79–97)
Platelets: 328 10*3/uL (ref 150–450)
RBC: 4 x10E6/uL (ref 3.77–5.28)
RDW: 12.7 % (ref 11.7–15.4)
WBC: 4.9 10*3/uL (ref 3.4–10.8)

## 2023-07-01 ENCOUNTER — Ambulatory Visit
Admission: RE | Admit: 2023-07-01 | Discharge: 2023-07-01 | Disposition: A | Discharge status: Home / Self Care | Source: Ambulatory Visit | Attending: Family | Admitting: Family

## 2023-07-01 DIAGNOSIS — Z1231 Encounter for screening mammogram for malignant neoplasm of breast: Secondary | ICD-10-CM

## 2023-09-17 ENCOUNTER — Telehealth: Payer: Self-pay | Admitting: Family Medicine

## 2023-09-18 NOTE — Telephone Encounter (Signed)
Disregard this error
